# Patient Record
Sex: Male | Born: 1965 | Race: White | Hispanic: No | Smoking: Current every day smoker
Health system: Southern US, Community
[De-identification: ages and names within clinical notes are randomized; demographics above are authoritative.]

## PROBLEM LIST (undated history)

## (undated) DIAGNOSIS — F419 Anxiety disorder, unspecified: Secondary | ICD-10-CM

## (undated) DIAGNOSIS — I10 Essential (primary) hypertension: Secondary | ICD-10-CM

## (undated) DIAGNOSIS — I119 Hypertensive heart disease without heart failure: Secondary | ICD-10-CM

## (undated) DIAGNOSIS — R079 Chest pain, unspecified: Secondary | ICD-10-CM

## (undated) DIAGNOSIS — F329 Major depressive disorder, single episode, unspecified: Secondary | ICD-10-CM

## (undated) DIAGNOSIS — D519 Vitamin B12 deficiency anemia, unspecified: Secondary | ICD-10-CM

## (undated) DIAGNOSIS — A4902 Methicillin resistant Staphylococcus aureus infection, unspecified site: Secondary | ICD-10-CM

## (undated) DIAGNOSIS — N19 Unspecified kidney failure: Secondary | ICD-10-CM

## (undated) DIAGNOSIS — G4733 Obstructive sleep apnea (adult) (pediatric): Secondary | ICD-10-CM

## (undated) DIAGNOSIS — L4050 Arthropathic psoriasis, unspecified: Secondary | ICD-10-CM

## (undated) DIAGNOSIS — M549 Dorsalgia, unspecified: Secondary | ICD-10-CM

## (undated) DIAGNOSIS — F32A Depression, unspecified: Secondary | ICD-10-CM

## (undated) DIAGNOSIS — J449 Chronic obstructive pulmonary disease, unspecified: Secondary | ICD-10-CM

## (undated) DIAGNOSIS — R55 Syncope and collapse: Secondary | ICD-10-CM

## (undated) DIAGNOSIS — G459 Transient cerebral ischemic attack, unspecified: Secondary | ICD-10-CM

## (undated) DIAGNOSIS — I214 Non-ST elevation (NSTEMI) myocardial infarction: Secondary | ICD-10-CM

## (undated) DIAGNOSIS — I639 Cerebral infarction, unspecified: Secondary | ICD-10-CM

## (undated) DIAGNOSIS — I472 Ventricular tachycardia, unspecified: Secondary | ICD-10-CM

## (undated) DIAGNOSIS — E785 Hyperlipidemia, unspecified: Secondary | ICD-10-CM

## (undated) DIAGNOSIS — G8929 Other chronic pain: Secondary | ICD-10-CM

## (undated) DIAGNOSIS — Z720 Tobacco use: Secondary | ICD-10-CM

## (undated) DIAGNOSIS — I509 Heart failure, unspecified: Secondary | ICD-10-CM

## (undated) DIAGNOSIS — I219 Acute myocardial infarction, unspecified: Secondary | ICD-10-CM

## (undated) DIAGNOSIS — M1990 Unspecified osteoarthritis, unspecified site: Secondary | ICD-10-CM

## (undated) DIAGNOSIS — Z9989 Dependence on other enabling machines and devices: Secondary | ICD-10-CM

## (undated) DIAGNOSIS — I4891 Unspecified atrial fibrillation: Secondary | ICD-10-CM

## (undated) HISTORY — DX: Tobacco use: Z72.0

## (undated) HISTORY — DX: Transient cerebral ischemic attack, unspecified: G45.9

## (undated) HISTORY — DX: Depression, unspecified: F32.A

## (undated) HISTORY — DX: Methicillin resistant Staphylococcus aureus infection, unspecified site: A49.02

## (undated) HISTORY — DX: Syncope and collapse: R55

## (undated) HISTORY — DX: Hyperlipidemia, unspecified: E78.5

## (undated) HISTORY — DX: Unspecified atrial fibrillation: I48.91

## (undated) HISTORY — DX: Chest pain, unspecified: R07.9

## (undated) HISTORY — DX: Major depressive disorder, single episode, unspecified: F32.9

## (undated) HISTORY — DX: Ventricular tachycardia: I47.2

## (undated) HISTORY — DX: Ventricular tachycardia, unspecified: I47.20

## (undated) HISTORY — DX: Unspecified osteoarthritis, unspecified site: M19.90

---

## 2007-12-18 DIAGNOSIS — I219 Acute myocardial infarction, unspecified: Secondary | ICD-10-CM

## 2007-12-18 HISTORY — DX: Acute myocardial infarction, unspecified: I21.9

## 2008-12-17 HISTORY — PX: VENTRICULAR ABLATION SURGERY: SHX835

## 2010-12-21 HISTORY — PX: CARDIAC CATHETERIZATION: SHX172

## 2012-12-17 DIAGNOSIS — N19 Unspecified kidney failure: Secondary | ICD-10-CM

## 2012-12-17 HISTORY — DX: Unspecified kidney failure: N19

## 2013-01-22 ENCOUNTER — Encounter (HOSPITAL_COMMUNITY): Payer: Self-pay | Admitting: *Deleted

## 2013-01-22 ENCOUNTER — Emergency Department (HOSPITAL_COMMUNITY): Payer: Managed Care, Other (non HMO)

## 2013-01-22 ENCOUNTER — Inpatient Hospital Stay (HOSPITAL_COMMUNITY)
Admission: EM | Admit: 2013-01-22 | Discharge: 2013-01-24 | DRG: 683 | Disposition: A | Discharge status: Home / Self Care | Payer: Managed Care, Other (non HMO) | Attending: Internal Medicine | Admitting: Internal Medicine

## 2013-01-22 DIAGNOSIS — F17200 Nicotine dependence, unspecified, uncomplicated: Secondary | ICD-10-CM | POA: Diagnosis present

## 2013-01-22 DIAGNOSIS — I4729 Other ventricular tachycardia: Secondary | ICD-10-CM | POA: Diagnosis present

## 2013-01-22 DIAGNOSIS — Z8679 Personal history of other diseases of the circulatory system: Secondary | ICD-10-CM

## 2013-01-22 DIAGNOSIS — Z23 Encounter for immunization: Secondary | ICD-10-CM

## 2013-01-22 DIAGNOSIS — I252 Old myocardial infarction: Secondary | ICD-10-CM

## 2013-01-22 DIAGNOSIS — I2510 Atherosclerotic heart disease of native coronary artery without angina pectoris: Secondary | ICD-10-CM | POA: Diagnosis present

## 2013-01-22 DIAGNOSIS — N179 Acute kidney failure, unspecified: Principal | ICD-10-CM | POA: Diagnosis present

## 2013-01-22 DIAGNOSIS — I472 Ventricular tachycardia, unspecified: Secondary | ICD-10-CM | POA: Diagnosis present

## 2013-01-22 DIAGNOSIS — Z79899 Other long term (current) drug therapy: Secondary | ICD-10-CM

## 2013-01-22 DIAGNOSIS — E785 Hyperlipidemia, unspecified: Secondary | ICD-10-CM | POA: Diagnosis present

## 2013-01-22 DIAGNOSIS — R079 Chest pain, unspecified: Secondary | ICD-10-CM

## 2013-01-22 DIAGNOSIS — Z7982 Long term (current) use of aspirin: Secondary | ICD-10-CM

## 2013-01-22 DIAGNOSIS — Z8673 Personal history of transient ischemic attack (TIA), and cerebral infarction without residual deficits: Secondary | ICD-10-CM

## 2013-01-22 DIAGNOSIS — E860 Dehydration: Secondary | ICD-10-CM | POA: Diagnosis present

## 2013-01-22 HISTORY — DX: Essential (primary) hypertension: I10

## 2013-01-22 HISTORY — DX: Acute myocardial infarction, unspecified: I21.9

## 2013-01-22 HISTORY — DX: Cerebral infarction, unspecified: I63.9

## 2013-01-22 LAB — CBC WITH DIFFERENTIAL/PLATELET
Basophils Relative: 1 % (ref 0–1)
HCT: 42 % (ref 39.0–52.0)
Hemoglobin: 14.8 g/dL (ref 13.0–17.0)
Lymphocytes Relative: 26 % (ref 12–46)
Lymphs Abs: 2 10*3/uL (ref 0.7–4.0)
MCHC: 35.2 g/dL (ref 30.0–36.0)
Monocytes Absolute: 0.6 10*3/uL (ref 0.1–1.0)
Monocytes Relative: 7 % (ref 3–12)
Neutro Abs: 5 10*3/uL (ref 1.7–7.7)

## 2013-01-22 LAB — TROPONIN I: Troponin I: <0.3 ng/mL (ref <0.30)

## 2013-01-22 LAB — BASIC METABOLIC PANEL
BUN: 32 mg/dL — ABNORMAL HIGH (ref 6–23)
CO2: 24 mEq/L (ref 19–32)
Chloride: 101 mEq/L (ref 96–112)
Creatinine, Ser: 2.68 mg/dL — ABNORMAL HIGH (ref 0.50–1.35)
Glucose, Bld: 98 mg/dL (ref 70–99)

## 2013-01-22 LAB — URINALYSIS, ROUTINE W REFLEX MICROSCOPIC
Bilirubin Urine: NEGATIVE
Glucose, UA: NEGATIVE mg/dL
Hgb urine dipstick: NEGATIVE
Specific Gravity, Urine: >1.03 — ABNORMAL HIGH (ref 1.005–1.030)

## 2013-01-22 MED ORDER — ENOXAPARIN SODIUM 40 MG/0.4ML ~~LOC~~ SOLN
40.0000 mg | SUBCUTANEOUS | Status: DC
  Administered 2013-01-22 – 2013-01-23 (×2): 40 mg via SUBCUTANEOUS
  Filled 2013-01-22 (×2): qty 0.4

## 2013-01-22 MED ORDER — ONDANSETRON HCL 4 MG PO TABS: 4.0000 mg | ORAL_TABLET | Freq: Four times a day (QID) | ORAL | Status: DC | PRN

## 2013-01-22 MED ORDER — OXYCODONE-ACETAMINOPHEN 5-325 MG PO TABS
1.0000 | ORAL_TABLET | Freq: Once | ORAL | Status: AC
  Administered 2013-01-22: 1 via ORAL
  Filled 2013-01-22: qty 1

## 2013-01-22 MED ORDER — AMITRIPTYLINE HCL 25 MG PO TABS
25.0000 mg | ORAL_TABLET | Freq: Every day | ORAL | Status: DC
  Administered 2013-01-22 – 2013-01-23 (×2): 25 mg via ORAL
  Filled 2013-01-22 (×2): qty 1

## 2013-01-22 MED ORDER — INFLUENZA VIRUS VACC SPLIT PF IM SUSP
0.5000 mL | INTRAMUSCULAR | Status: AC
  Administered 2013-01-23: 0.5 mL via INTRAMUSCULAR
  Filled 2013-01-22: qty 0.5

## 2013-01-22 MED ORDER — ASPIRIN 325 MG PO TABS: 325.0000 mg | ORAL_TABLET | Freq: Once | ORAL | Status: DC

## 2013-01-22 MED ORDER — SODIUM CHLORIDE 0.9 % IV SOLN
INTRAVENOUS | Status: DC
  Administered 2013-01-22 – 2013-01-24 (×5): via INTRAVENOUS

## 2013-01-22 MED ORDER — NITROGLYCERIN 0.4 MG SL SUBL: 0.4000 mg | SUBLINGUAL_TABLET | SUBLINGUAL | Status: DC | PRN

## 2013-01-22 MED ORDER — ALBUTEROL SULFATE HFA 108 (90 BASE) MCG/ACT IN AERS: 2.0000 | INHALATION_SPRAY | Freq: Four times a day (QID) | RESPIRATORY_TRACT | Status: DC | PRN

## 2013-01-22 MED ORDER — OXYCODONE-ACETAMINOPHEN 5-325 MG PO TABS
2.0000 | ORAL_TABLET | Freq: Four times a day (QID) | ORAL | Status: DC | PRN
  Administered 2013-01-22 – 2013-01-24 (×6): 2 via ORAL
  Filled 2013-01-22 (×6): qty 2

## 2013-01-22 MED ORDER — SOTALOL HCL 80 MG PO TABS
ORAL_TABLET | ORAL | Status: AC
  Filled 2013-01-22: qty 1

## 2013-01-22 MED ORDER — ACETAMINOPHEN 650 MG RE SUPP: 650.0000 mg | Freq: Four times a day (QID) | RECTAL | Status: DC | PRN

## 2013-01-22 MED ORDER — SOTALOL HCL 80 MG PO TABS
80.0000 mg | ORAL_TABLET | Freq: Two times a day (BID) | ORAL | Status: DC
  Administered 2013-01-23 – 2013-01-24 (×3): 80 mg via ORAL
  Filled 2013-01-22 (×8): qty 1

## 2013-01-22 MED ORDER — ACETAMINOPHEN 325 MG PO TABS: 650.0000 mg | ORAL_TABLET | Freq: Four times a day (QID) | ORAL | Status: DC | PRN

## 2013-01-22 MED ORDER — ATORVASTATIN CALCIUM 40 MG PO TABS
40.0000 mg | ORAL_TABLET | Freq: Every day | ORAL | Status: DC
  Administered 2013-01-23: 40 mg via ORAL
  Filled 2013-01-22 (×2): qty 1

## 2013-01-22 MED ORDER — PANTOPRAZOLE SODIUM 40 MG PO TBEC
40.0000 mg | DELAYED_RELEASE_TABLET | Freq: Every day | ORAL | Status: DC
  Administered 2013-01-23 – 2013-01-24 (×2): 40 mg via ORAL
  Filled 2013-01-22 (×2): qty 1

## 2013-01-22 MED ORDER — ONDANSETRON HCL 4 MG/2ML IJ SOLN: 4.0000 mg | Freq: Four times a day (QID) | INTRAMUSCULAR | Status: DC | PRN

## 2013-01-22 MED ORDER — SODIUM CHLORIDE 0.9 % IV BOLUS (SEPSIS)
1000.0000 mL | Freq: Once | INTRAVENOUS | Status: AC
  Administered 2013-01-22: 1000 mL via INTRAVENOUS

## 2013-01-22 MED ORDER — LORAZEPAM 1 MG PO TABS
1.0000 mg | ORAL_TABLET | Freq: Three times a day (TID) | ORAL | Status: DC
  Administered 2013-01-22 – 2013-01-24 (×5): 1 mg via ORAL
  Filled 2013-01-22 (×5): qty 1

## 2013-01-22 MED ORDER — ASPIRIN 325 MG PO TABS
325.0000 mg | ORAL_TABLET | Freq: Every day | ORAL | Status: DC
  Administered 2013-01-23 – 2013-01-24 (×2): 325 mg via ORAL
  Filled 2013-01-22 (×2): qty 1

## 2013-01-22 MED ORDER — SODIUM CHLORIDE 0.9 % IJ SOLN: 3.0000 mL | Freq: Two times a day (BID) | INTRAMUSCULAR | Status: DC

## 2013-01-22 NOTE — ED Notes (Signed)
Pt with mid CP x 3 days, takes 325 mg ASA everyday and states has had today's dose, c/o dizziness x 1 week, SOB x 1 week, N/V/D x 1 week, states able to PO fluids, last vomited the other day

## 2013-01-22 NOTE — ED Notes (Signed)
Patient denies any dizziness upon sitting and standing.  

## 2013-01-22 NOTE — ED Notes (Signed)
Pt states he took one NTG yesterday without relief from CP, denies taking any today

## 2013-01-22 NOTE — ED Notes (Signed)
MD at bedside.-Memon at bedside

## 2013-01-22 NOTE — H&P (Signed)
[facility] History and Physical  Danny Shea WGN:[id number] DOB: October 28, 1966 DOA: 01/22/2013  Referring physician: Dr. Bebe Shaggy PCP: No primary provider on file.  Specialists: Cardiology in [place]  Chief Complaint: chest tightness  HPI: Danny Shea is a 47 y.o. male with multiple medical problems who recently moved back to [place] approximately 2 weeks ago. He has a history of coronary disease in the past as well as ventricular tachycardia. He presents to the emergency room today with complaints of chest pain. He describes this as a heaviness in his chest and "a ton of bricks" on his chest. These episodes have been intermittent lasting approximately 10 minutes. They occur on exertion and at rest. They're associated with shortness of breath and nausea. They're nonradiating. He reports having a stress test at [facility] approximately 2 years ago and was told that was unremarkable.  He also reports feeling increasingly dizzy and having episodes of presyncope. He's not had a frank syncopal episode. He feels that when he stands up he feels increasingly dizzy and feels as though he would pass out. He is also noticed that he is urinating less. He feels increasingly thirsty. Review of his medications show that he is on a ARB as well as hydrochlorothiazide. The patient also reports his by mouth intake over the past 2 weeks has not been good. He is felt increasingly nauseous for the past few weeks. He's also had intermittent episodes of vomiting, and for the last 3 days has reported some loose stools.  Workup in the emergency room revealed elevated creatinine. It is unclear whether this is acute. We'll be admitted for further evaluation.  Review of Systems: Pertinent positives as per history of present illness, otherwise  Past Medical History  Diagnosis Date  . Coronary artery disease   . Hypertension   . Hypercholesteremia   . Stroke   . Myocardial infarction     Past Surgical History  Procedure Date  . Cardiac electrophysiology study and ablation    Social History:  reports that he has been smoking.  He does not have any smokeless tobacco history on file. He reports that he does not drink alcohol or use illicit drugs.   Allergies  Allergen Reactions  . Darvocet (Propoxyphene-Acetaminophen)     Unknown reaction  . Feldene (Piroxicam)     Unknown reaction    History reviewed. No pertinent family history.   Prior to Admission medications   Medication Sig Start Date End Date Taking? Authorizing Provider  albuterol (PROVENTIL HFA;VENTOLIN HFA) 108 (90 BASE) MCG/ACT inhaler Inhale 2 puffs into the lungs every 6 (six) hours as needed. For shortness of breath/wheezing   Yes Historical Provider, MD  amitriptyline (ELAVIL) 25 MG tablet Take 25 mg by mouth at bedtime.   Yes Historical Provider, MD  aspirin 325 MG tablet Take 325 mg by mouth daily.    Yes Historical Provider, MD  atorvastatin (LIPITOR) 40 MG tablet Take 40 mg by mouth daily.   Yes Historical Provider, MD  LORazepam (ATIVAN) 1 MG tablet Take 1 mg by mouth every 8 (eight) hours.   Yes Historical Provider, MD  losartan-hydrochlorothiazide (HYZAAR) 100-25 MG per tablet Take 1 tablet by mouth daily.   Yes Historical Provider, MD  nitroGLYCERIN (NITROSTAT) 0.4 MG SL tablet Place 0.4 mg under the tongue every 5 (five) minutes as needed.   Yes Historical Provider, MD  omeprazole (PRILOSEC) 20 MG capsule Take 20 mg by mouth daily.   Yes Historical Provider, MD  sotalol (BETAPACE) 80 MG tablet Take 80 mg by mouth 2 (two) times daily.   Yes Historical Provider, MD   Physical Exam: Filed Vitals:   01/22/13 1626 01/22/13 1627 01/22/13 1628 01/22/13 1736  BP: 112/75 125/70 131/77 121/70  Pulse: 64 65 72 66  Temp:      TempSrc:      Resp:    18  Height:      Weight:      SpO2:    96%     General:  No acute distress  Eyes: Pupils are equal round reactive to light  ENT: Mucous  membranes are dry  Neck: Supple  Cardiovascular: S1, S2, regular rate and rhythm  Respiratory: Clear to auscultation bilaterally  Abdomen: Soft, nontender, nondistended, bowel sounds are active  Skin: Normal, various tattoos are present  Musculoskeletal: Deferred  Psychiatric: The patient is tearful, denies any history of worsening depression, reports that he is "emotional"  Neurologic: Grossly intact, nonfocal  Labs on Admission:  Basic Metabolic Panel:  Lab 01/22/13 4540  NA 137  K 3.9  CL 101  CO2 24  GLUCOSE 98  BUN 32*  CREATININE 2.68*  CALCIUM 9.7  MG --  PHOS --   Liver Function Tests: No results found for this basename: AST:5,ALT:5,ALKPHOS:5,BILITOT:5,PROT:5,ALBUMIN:5 in the last 168 hours No results found for this basename: LIPASE:5,AMYLASE:5 in the last 168 hours No results found for this basename: AMMONIA:5 in the last 168 hours CBC:  Lab 01/22/13 1534  WBC 7.8  NEUTROABS 5.0  HGB 14.8  HCT 42.0  MCV 89.0  PLT 230   Cardiac Enzymes:  Lab 01/22/13 1534  CKTOTAL --  CKMB --  CKMBINDEX --  TROPONINI <0.30    BNP (last 3 results) No results found for this basename: PROBNP:3 in the last 8760 hours CBG: No results found for this basename: GLUCAP:5 in the last 168 hours  Radiological Exams on Admission: Dg Chest Portable 1 View  01/22/2013  *RADIOLOGY REPORT*  Clinical Data: Chest tightness and dizziness.  Nausea.  Smoker. High blood pressure.  Prior heart attack.  PORTABLE CHEST - 1 VIEW  Comparison: None.  Findings: Mild central pulmonary vascular prominence.  No infiltrate, congestive heart failure or pneumothorax.  Heart size top normal.  IMPRESSION: Mild central pulmonary vascular prominence.  No infiltrate, congestive heart failure or pneumothorax.  Heart size top normal   Original Report Authenticated By: Lacy Duverney, M.D.     EKG: Independently reviewed. No acute ST-T changes  Assessment/Plan Active Problems:  Acute renal failure   Chest pain  Coronary artery disease  History of ventricular tachycardia  Dehydration  Tobacco abuse   1. Chest pain. Unclear etiology. Due to the patient's past history, he'll be admitted for observation. We will continue aspirin and recheck an EKG in the morning. Cardiology consultation will be obtained since he will likely need outpatient followup. We will continue the remainder of his outpatient medications. 2. Acute renal failure. Possibly related to dehydration in the setting of ARB and associated acute tubular necrosis. We will hold his ARB and hydrochlorothiazide for now. Provide IV hydration and check renal ultrasound. We'll also check urinalysis. If creatinine worsens, then he may need a nephrology evaluation. 3. History of ventricular tachycardia. Continue sotalol. 4. Hyperlipidemia. Continue Lipitor.    Code Status: Full code Family Communication: Discussed with patient at bedside Disposition Plan: Possible discharge tomorrow  Time spent: 60 minutes  [place] [facility] Pager [phone]  If 7PM-7AM, please contact night-coverage [email or web address] Password  [email or web address] 01/22/2013, 5:58 PM

## 2013-01-22 NOTE — ED Notes (Signed)
MD at bedside. 

## 2013-01-22 NOTE — ED Notes (Signed)
Chest pain, dizziness, nausea, for 3 days.  No injury

## 2013-01-22 NOTE — ED Provider Notes (Signed)
History     CSN: [id number]  Arrival date & time 01/22/13  1524   First MD Initiated Contact with Patient 01/22/13 1536      Chief Complaint  Patient presents with  . Chest Pain     Patient is a 47 y.o. male presenting with chest pain. The history is provided by the patient and the spouse.  Chest Pain Episode onset: a brief time ago. Chest pain occurs frequently. The chest pain is worsening. Associated with: nothing. The severity of the pain is moderate. The quality of the pain is described as pressure-like. The pain does not radiate. Primary symptoms include shortness of breath, vomiting and dizziness.  Dizziness also occurs with vomiting and diaphoresis.  Associated symptoms include diaphoresis. He tried aspirin (rest) for the symptoms.    Pt reports he has long h/o chest pain.  He reports he usually gets two episodes per week.  He now reports past several weeks he has had increased frequency of chest tightness    He also reports recent vomiting/diarrhea He also reports dizziness upon standing but no syncope reported Past Medical History  Diagnosis Date  . Coronary artery disease   . Hypertension   . Hypercholesteremia   . Stroke     Past Surgical History  Procedure Date  . Cardiac electrophysiology study and ablation     History reviewed. No pertinent family history.  History  Substance Use Topics  . Smoking status: Current Every Day Smoker  . Smokeless tobacco: Not on file  . Alcohol Use: No      Review of Systems  Constitutional: Positive for diaphoresis.  Respiratory: Positive for shortness of breath.   Cardiovascular: Positive for chest pain.  Gastrointestinal: Positive for vomiting and diarrhea.  Neurological: Positive for dizziness. Negative for syncope.  Psychiatric/Behavioral: Negative for agitation.  All other systems reviewed and are negative.    Allergies  Darvocet and Feldene  Home Medications  No current outpatient prescriptions on  file.  BP 138/65  Pulse 69  Temp 98.2 F (36.8 C) (Oral)  Resp 22  Ht 5\' 11"  (1.803 m)  Wt 210 lb (95.255 kg)  BMI 29.29 kg/m2  SpO2 99%  Physical Exam CONSTITUTIONAL: Well developed/well nourished HEAD AND FACE: Normocephalic/atraumatic EYES: EOMI/PERRL ENMT: Mucous membranes moist NECK: supple no meningeal signs SPINE:entire spine nontender CV: S1/S2 noted, no murmurs/rubs/gallops noted LUNGS: Lungs are clear to auscultation bilaterally, no apparent distress ABDOMEN: soft, nontender, no rebound or guarding GU:no cva tenderness NEURO: Pt is awake/alert, moves all extremitiesx4 EXTREMITIES: pulses normal, full ROM, no calf tenderness SKIN: warm, color normal PSYCH: no abnormalities of mood noted  ED Course  Procedures (including critical care time)   Labs Reviewed  CBC WITH DIFFERENTIAL  BASIC METABOLIC PANEL  TROPONIN I   Pt reports he has long h/o CP with increasing frequency.  Could be early unstable angina. He is feeling improved at this time.  He reports also h/o CAD and also v-tach previously.  This was all done at an outside hospital in [place].  Also, noted to renal failure that I suspect is new for him.  He does not recall history of renal failure.  He is on multiple meds (losartan/losartan) that could affect this as well as he could be dehydrated due to recent vomitng/diarrhea. D/w hospitalist to admit to tele  He has taken ASA Today  MDM  Nursing notes including past medical history and social history reviewed and considered in documentation Labs/vital reviewed and considered xrays reviewed and  considered       Date: 01/22/2013  Rate: 68  Rhythm: normal sinus rhythm  QRS Axis: normal  Intervals: normal  ST/T Wave abnormalities: nonspecific ST changes  Conduction Disutrbances:none  Narrative Interpretation:   Old EKG Reviewed: none available AT TIME OF INTERPRETATION    Joya Gaskins, MD 01/22/13 [phone]

## 2013-01-23 ENCOUNTER — Encounter (HOSPITAL_COMMUNITY): Payer: Self-pay | Admitting: Adult Health

## 2013-01-23 ENCOUNTER — Observation Stay (HOSPITAL_COMMUNITY): Payer: Managed Care, Other (non HMO)

## 2013-01-23 DIAGNOSIS — Z8679 Personal history of other diseases of the circulatory system: Secondary | ICD-10-CM

## 2013-01-23 DIAGNOSIS — R072 Precordial pain: Secondary | ICD-10-CM

## 2013-01-23 LAB — BASIC METABOLIC PANEL
BUN: 31 mg/dL — ABNORMAL HIGH (ref 6–23)
CO2: 22 mEq/L (ref 19–32)
Chloride: 102 mEq/L (ref 96–112)
GFR calc Af Amer: 38 mL/min — ABNORMAL LOW (ref >90)
Potassium: 3.5 mEq/L (ref 3.5–5.1)

## 2013-01-23 LAB — TROPONIN I: Troponin I: <0.3 ng/mL (ref <0.30)

## 2013-01-23 LAB — CBC
HCT: 39.3 % (ref 39.0–52.0)
Hemoglobin: 13.9 g/dL (ref 13.0–17.0)
MCV: 88.9 fL (ref 78.0–100.0)
WBC: 6.8 10*3/uL (ref 4.0–10.5)

## 2013-01-23 MED ORDER — ENSURE COMPLETE PO LIQD
237.0000 mL | Freq: Two times a day (BID) | ORAL | Status: DC
  Administered 2013-01-23: 237 mL via ORAL

## 2013-01-23 MED ORDER — OXYCODONE HCL 5 MG PO TABS
5.0000 mg | ORAL_TABLET | ORAL | Status: DC | PRN
  Administered 2013-01-23 – 2013-01-24 (×4): 5 mg via ORAL
  Filled 2013-01-23 (×4): qty 1

## 2013-01-23 MED ORDER — CHLORHEXIDINE GLUCONATE CLOTH 2 % EX PADS
6.0000 | MEDICATED_PAD | Freq: Every day | CUTANEOUS | Status: DC
  Administered 2013-01-23 – 2013-01-24 (×2): 6 via TOPICAL

## 2013-01-23 MED ORDER — MUPIROCIN 2 % EX OINT
1.0000 "application " | TOPICAL_OINTMENT | Freq: Two times a day (BID) | CUTANEOUS | Status: DC
  Administered 2013-01-23 – 2013-01-24 (×3): 1 via NASAL
  Filled 2013-01-23: qty 22

## 2013-01-23 NOTE — Progress Notes (Signed)
*  PRELIMINARY RESULTS* Echocardiogram 2D Echocardiogram has been performed.  Danny Shea 01/23/2013, 12:06 PM 

## 2013-01-23 NOTE — Consult Note (Signed)
CARDIOLOGY CONSULT NOTE  Patient ID: Danny Shea MRN: [id number] DOB/AGE: April 14, 1966 47 y.o.  Admit date: 01/22/2013 Referring Physician: PTH-Memon Primary PhysicianNo primary provider on file. Primary Cardiologist: (New) Danny Shea Reason for Consultation: Chest Pain with Hx of CAD, VT s/p ablation Active Problems:  Acute renal failure  Chest pain  Coronary artery disease  History of ventricular tachycardia  Dehydration  Tobacco abuse  HPI: Danny Shea is a 47 y/o patient who recently moved to [place] from [place]. He was admitted with recurrent pre-syncopal episodes and chest pain. He was found to be in renal failure with creatinine of 2.68. EKG showed sinus bradycardia without acute changes.   He had been followed by [facility], Danny Shea and by and EP cardiologist.Danny Shea, M.D. He has had a VT ablation in 2010, and has been treated with sotolol. He has been lost to cardiac follow up for > 2 years, as he states he did not get along with the primary cardiologist but continued to take his medications which were supplied by his PCP. He has a history of prior MI, but no interventions. He states that he had a stress test a couple of years or more ago that was negative. Records from [place] are being requested.   Review of systems complete and found to be negative unless listed above  Past Medical History  Diagnosis Date  . Coronary artery disease   . Hypertension   . Hypercholesteremia   . Stroke   . Myocardial infarction     Family History  Problem Relation Age of Onset  . Heart attack Mother   . Heart failure Mother   . Heart attack Brother   . Cancer Father     History   Social History  . Marital Status: Single    Spouse Name: N/A    Number of Children: N/A  . Years of Education: N/A   Occupational History  . Not on file.   Social History Main Topics  . Smoking status: Current Every Day Smoker -- 0.5 packs/day     Types: Cigarettes  . Smokeless tobacco: Not on file  . Alcohol Use: No  . Drug Use: No  . Sexually Active:      Comment: states has reduced to 1/2 pack ppd and is trying to quit "all of it"   Other Topics Concern  . Not on file   Social History Narrative  . No narrative on file    Past Surgical History  Procedure Date  . Cardiac electrophysiology study and ablation     Prescriptions prior to admission  Medication Sig Dispense Refill  . albuterol (PROVENTIL HFA;VENTOLIN HFA) 108 (90 BASE) MCG/ACT inhaler Inhale 2 puffs into the lungs every 6 (six) hours as needed. For shortness of breath/wheezing      . amitriptyline (ELAVIL) 25 MG tablet Take 25 mg by mouth at bedtime.      Marland Kitchen aspirin 325 MG tablet Take 325 mg by mouth daily.       Marland Kitchen atorvastatin (LIPITOR) 40 MG tablet Take 40 mg by mouth daily.      Marland Kitchen LORazepam (ATIVAN) 1 MG tablet Take 1 mg by mouth every 8 (eight) hours.      Marland Kitchen losartan-hydrochlorothiazide (HYZAAR) 100-25 MG per tablet Take 1 tablet by mouth daily.      . nitroGLYCERIN (NITROSTAT) 0.4 MG SL tablet Place 0.4 mg under the tongue every 5 (five) minutes as needed.      Marland Kitchen omeprazole (  PRILOSEC) 20 MG capsule Take 20 mg by mouth daily.      . sotalol (BETAPACE) 80 MG tablet Take 80 mg by mouth 2 (two) times daily.       Physical Exam: Blood pressure 95/60, pulse 60, temperature 97.5 F (36.4 C), temperature source Oral, resp. rate 18, height 5\' 11"  (1.803 m), weight 210 lb (95.255 kg), SpO2 99.00%.   Lab Results  Component Value Date   WBC 6.8 01/23/2013   HGB 13.9 01/23/2013   HCT 39.3 01/23/2013   MCV 88.9 01/23/2013   PLT 214 01/23/2013    Lab 01/23/13 0317  NA 136  K 3.5  CL 102  CO2 22  BUN 31*  CREATININE 2.23*  CALCIUM 9.0  PROT --  BILITOT --  ALKPHOS --  ALT --  AST --  GLUCOSE 115*   Lab Results  Component Value Date   TROPONINI <0.30 01/23/2013    Dg Chest Portable 1 View  01/22/2013  *RADIOLOGY REPORT*  Clinical Data: Chest tightness and  dizziness.  Nausea.  Smoker. High blood pressure.  Prior heart attack.  PORTABLE CHEST - 1 VIEW  Comparison: None.  Findings: Mild central pulmonary vascular prominence.  No infiltrate, congestive heart failure or pneumothorax.  Heart size top normal.  IMPRESSION: Mild central pulmonary vascular prominence.  No infiltrate, congestive heart failure or pneumothorax.  Heart size top normal   Original Report Authenticated By: Lacy Duverney, M.D.    NWG:NFAOZ bradycardia rate of 57 bpm.   Echocardiogram:  01/23/2013 mild to moderate biatrial enlargement; mild to moderate LVH; normal EF; no significant valvular abnormalities  ASSESSMENT AND PLAN:   1. Chest Pain: History per patient report of prior MI. He has not been followed by cardiology for greater than two years. Records from [facility] have been requested. Echocardiogram pending. EKG negative for ischemia and troponin is negative. Will be happy to follow him in our office once discharged.  2. History of VT s/p Ablation: Followed by EP in [place], placed on sotolol. He has been having frequent palpitations with near syncope for 2 months. 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 OP cardiac monitor is recommended on discharge for ongoing assessment associated with symptoms.  3.Acute Renal Failure: Creatinine has improved with IV hydration. Follow up labs will be completed next week. He is not on any nephrotoxic medications.   4. CAD: Records are requested. No evidence of prior MI on EKG  5. Hypertension: Currently controlled. No medication adjustments at this time.  Danny Mare. Lyman Bishop Danny Shea [name] 01/23/2013, 11:24 AM  Cardiology Attending Patient interviewed and examined. Discussed with Danny Reining,  Danny Shea.  Above note annotated and modified based upon my findings.  With normal left ventricular systolic function and no known RV dysplasia, WPW, HOCM or other structural heart disease, likelihood of a fatal arrhythmia is small. Recent episodes of syncope are likely do to prerenal azotemia and dehydration causing transient hypotension.  No records are currently available for review. Patient reports normal coronary angiography in 2011. Likelihood of interval development of significant coronary disease extremely low. Once acute renal failure has been adequately treated, patient can be discharged to followup in the office within one week.  Discharge medication should not include diuretic, ACE inhibitor or ARB.  [place] Bing, Danny Shea 01/23/2013, 7:26 PM

## 2013-01-23 NOTE — Progress Notes (Signed)
UR Chart Review Completed  

## 2013-01-23 NOTE — Progress Notes (Signed)
INITIAL NUTRITION ASSESSMENT  DOCUMENTATION CODES Per approved criteria  -Not Applicable   INTERVENTION: 1. Ensure Complete BID which provides 700 kcal and 26 grams of protein, per pt request: please provide with strawberry-flavored and place in freezer prior to him receiving it  2. RD will continue to monitor  NUTRITION DIAGNOSIS: Inadequate oral intake related to poor appetite as evidenced by patient report.   Goal: Meet >/=90% estimated nutrition needs  Monitor:  Tolerance of Ensure Complete, PO's, I/O's, weight trends  Reason for Assessment: Malnutrition Screening Tool  47 y.o. male  Admitting Dx: Chest pain, dizziness, nausea  ASSESSMENT: Pt admitted with c/o chest pain, dizziness, and nausea x3 days. Pt also experiencing vomiting associated with nausea.  Pt reports usual weight of 221 lbs although he is unsure of when he last weighed this. States that he has had a very poor appetite for ~1 week and that he feels like he is "starving to death" but once his meals come he feels full after a few small bites.  Pt at risk for malnutrition but unable to dx at this time based on inadequate information related to meal completion and time frame for his ~11 pound weight loss.  Will provide with Ensure Complete BID. Pt requests strawberry-flavored and would like it to be placed in the freezer prior to his receiving it.  Height: Ht Readings from Last 1 Encounters:  01/22/13 5\' 11"  (1.803 m)    Weight: Wt Readings from Last 1 Encounters:  01/22/13 210 lb (95.255 kg)    Ideal Body Weight: 172 lbs (78.2 kg)  % Ideal Body Weight: 122%  Wt Readings from Last 10 Encounters:  01/22/13 210 lb (95.255 kg)    Usual Body Weight: 221 lbs (100.5 kg)  % Usual Body Weight: 95%  BMI:  Body mass index is 29.29 kg/(m^2).--Overweight  Estimated Nutritional Needs: Kcal: 2000-2200 Protein: 100-110 grams Fluid: 2-2.2 L/day  Skin: intact, no wounds noted  Diet Order:  Cardiac  EDUCATION NEEDS: -No education needs identified at this time   Intake/Output Summary (Last 24 hours) at 01/23/13 0951 Last data filed at 01/23/13 0700  Gross per 24 hour  Intake 2129.17 ml  Output      1 ml  Net 2128.17 ml    Last BM: PTA  Labs:   Lab 01/23/13 0317 01/22/13 1534  NA 136 137  K 3.5 3.9  CL 102 101  CO2 22 24  BUN 31* 32*  CREATININE 2.23* 2.68*  CALCIUM 9.0 9.7  MG -- --  PHOS -- --  GLUCOSE 115* 98    CBG (last 3)  No results found for this basename: GLUCAP:3 in the last 72 hours  Scheduled Meds:   . amitriptyline  25 mg Oral QHS  . aspirin  325 mg Oral Once  . aspirin  325 mg Oral Daily  . atorvastatin  40 mg Oral q1800  . Chlorhexidine Gluconate Cloth  6 each Topical Q0600  . enoxaparin (LOVENOX) injection  40 mg Subcutaneous Q24H  . LORazepam  1 mg Oral Q8H  . mupirocin ointment  1 application Nasal BID  . pantoprazole  40 mg Oral Daily  . sodium chloride  3 mL Intravenous Q12H  . sotalol  80 mg Oral BID    Continuous Infusions:   . sodium chloride 125 mL/hr at 01/22/13 2053    Past Medical History  Diagnosis Date  . Coronary artery disease   . Hypertension   . Hypercholesteremia   . Stroke   .  Myocardial infarction     Past Surgical History  Procedure Date  . Cardiac electrophysiology study and ablation     [facility] Dietetic Intern # [phone]

## 2013-01-23 NOTE — Progress Notes (Signed)
Note reviewed. Agree with plan.  Lynn Fidencia Mccloud MS,RD,LDN Office: #[phone] Pager: #[phone] 

## 2013-01-23 NOTE — Care Management Note (Addendum)
    Page 1 of 1   01/23/2013     11:16:20 AM   CARE MANAGEMENT NOTE 01/23/2013  Patient:  EHSAN, CORVIN   Account Number:  [id number]  Date Initiated:  01/23/2013  Documentation initiated by:  Rosemary Holms  Subjective/Objective Assessment:   Pt admitted into Observation. Lives with his finance. Recently moved back to this area and needs a PCP. RCM called and Fanta's office able to see pt and ins.plan next week. Appt made.     Action/Plan:   Anticipated DC Date:  01/23/2013   Anticipated DC Plan:  HOME/SELF CARE      DC Planning Services  CM consult      Choice offered to / List presented to:             Status of service:  Completed, signed off Medicare Important Message given?   (If response is "NO", the following Medicare IM given date fields will be blank) Date Medicare IM given:   Date Additional Medicare IM given:    Discharge Disposition:  HOME/SELF CARE  Per UR Regulation:    If discussed at Long Length of Stay Meetings, dates discussed:    Comments:  01/23/13 Rosemary Holms RN BSN CM When [facility] gave info about appt to pt, he is unsure of his schedule next week. He is aware that if the date is not ok with his schedule, he needs to call Dr. Letitia Neri office to reschedule.  Dr. Letitia Neri office has agreed to follow Monday, 2/10 lab work. Dr. Felecia Shelling will call pt regarding results. Appt rescheduled for 2/17.

## 2013-01-23 NOTE — Progress Notes (Signed)
[facility] PROGRESS NOTE  Danny Shea GNF:[id number] DOB: Sep 21, 1966 DOA: 01/22/2013 PCP: No primary provider on file.  Assessment/Plan: 1. Chest pain. The patient has ruled out for acute coronary syndrome with negative cardiac enzymes. EKG does not show acute changes. Cardiology has been consulted. 2-D echo has been completed, report pending. 2. Acute renal failure. Likely prerenal secondary to dehydration as well as ACE inhibitor. Renal function has improved with IV fluids. We'll continue to give IV fluids for now. Followup renal ultrasound. Urinalysis does not show any signs of infections and no proteinuria. Patient will need repeat creatinine next week to ensure that is continuing to decrease. 3. Presyncope. Likely related to dehydration. Improved 4. History of VT status post ablation. Continue on sotalol  Code Status: full Family Communication: discussed with patient and wife at the bedside Disposition Plan: discharge later today or tomorrow, once cleared by cardiology   Consultants:  [facility]  Procedures:  none  Antibiotics:  none  HPI/Subjective: Patient is feeling better today.  Denies any chest pain, no dizziness on standing.  No vomiting or diarrhea.  Objective: Filed Vitals:   01/22/13 1736 01/22/13 1811 01/22/13 2214 01/23/13 0456  BP: 121/70 100/62 115/62 95/60  Pulse: 66 59 55 60  Temp:  97.7 F (36.5 C) 97.4 F (36.3 C) 97.5 F (36.4 C)  TempSrc:  Oral  Oral  Resp: 18 18 18    Height:  5\' 11"  (1.803 m)    Weight:  95.255 kg (210 lb)    SpO2: 96% 97% 99%     Intake/Output Summary (Last 24 hours) at 01/23/13 1142 Last data filed at 01/23/13 0800  Gross per 24 hour  Intake 2489.17 ml  Output      1 ml  Net 2488.17 ml   Filed Weights   01/22/13 1533 01/22/13 1811  Weight: 95.255 kg (210 lb) 95.255 kg (210 lb)    Exam:   General:  NAD  Cardiovascular: S1, S2, RRR  Respiratory: CTA B  Abdomen: soft, nt, nd, bs+  Data  Reviewed: Basic Metabolic Panel:  Lab 01/23/13 8469 01/22/13 1534  NA 136 137  K 3.5 3.9  CL 102 101  CO2 22 24  GLUCOSE 115* 98  BUN 31* 32*  CREATININE 2.23* 2.68*  CALCIUM 9.0 9.7  MG -- --  PHOS -- --   Liver Function Tests: No results found for this basename: AST:5,ALT:5,ALKPHOS:5,BILITOT:5,PROT:5,ALBUMIN:5 in the last 168 hours No results found for this basename: LIPASE:5,AMYLASE:5 in the last 168 hours No results found for this basename: AMMONIA:5 in the last 168 hours CBC:  Lab 01/23/13 0317 01/22/13 1534  WBC 6.8 7.8  NEUTROABS -- 5.0  HGB 13.9 14.8  HCT 39.3 42.0  MCV 88.9 89.0  PLT 214 230   Cardiac Enzymes:  Lab 01/23/13 0316 01/22/13 2112 01/22/13 1534  CKTOTAL -- -- --  CKMB -- -- --  CKMBINDEX -- -- --  TROPONINI <0.30 <0.30 <0.30   BNP (last 3 results) No results found for this basename: PROBNP:3 in the last 8760 hours CBG: No results found for this basename: GLUCAP:5 in the last 168 hours  Recent Results (from the past 240 hour(s))  MRSA PCR SCREENING     Status: Abnormal   Collection Time   01/22/13  5:40 PM      Component Value Range Status Comment   MRSA by PCR POSITIVE (*) NEGATIVE Final      Studies: Dg Chest Portable 1 View  01/22/2013  *RADIOLOGY REPORT*  Clinical Data: Chest tightness and dizziness.  Nausea.  Smoker. High blood pressure.  Prior heart attack.  PORTABLE CHEST - 1 VIEW  Comparison: None.  Findings: Mild central pulmonary vascular prominence.  No infiltrate, congestive heart failure or pneumothorax.  Heart size top normal.  IMPRESSION: Mild central pulmonary vascular prominence.  No infiltrate, congestive heart failure or pneumothorax.  Heart size top normal   Original Report Authenticated By: Lacy Duverney, M.D.     Scheduled Meds:   . amitriptyline  25 mg Oral QHS  . aspirin  325 mg Oral Once  . aspirin  325 mg Oral Daily  . atorvastatin  40 mg Oral q1800  . Chlorhexidine Gluconate Cloth  6 each Topical Q0600  .  enoxaparin (LOVENOX) injection  40 mg Subcutaneous Q24H  . LORazepam  1 mg Oral Q8H  . mupirocin ointment  1 application Nasal BID  . pantoprazole  40 mg Oral Daily  . sodium chloride  3 mL Intravenous Q12H  . sotalol  80 mg Oral BID   Continuous Infusions:   . sodium chloride 125 mL/hr at 01/22/13 2053    Active Problems:  Acute renal failure  Chest pain  Coronary artery disease  History of ventricular tachycardia  Dehydration  Tobacco abuse    Time spent:    Danny Shea  [facility] Pager [phone]. If 8PM-8AM, please contact night-coverage at [email or web address], password [facility] 01/23/2013, 11:42 AM  LOS: 1 day

## 2013-01-24 LAB — BASIC METABOLIC PANEL
BUN: 22 mg/dL (ref 6–23)
Creatinine, Ser: 1.66 mg/dL — ABNORMAL HIGH (ref 0.50–1.35)
GFR calc Af Amer: 55 mL/min — ABNORMAL LOW (ref >90)
GFR calc non Af Amer: 47 mL/min — ABNORMAL LOW (ref >90)
Glucose, Bld: 93 mg/dL (ref 70–99)

## 2013-01-24 NOTE — Discharge Summary (Signed)
Physician Discharge Summary  Danny Shea EAV:[id number] DOB: 07-20-1966 DOA: 01/22/2013  PCP: No primary provider on file.  Admit date: 01/22/2013 Discharge date: 01/24/2013  Time spent: 40 minutes  Recommendations for Outpatient Follow-up:  1. Follow up with [facility] next week 2. Follow up with Dr. Felecia Shelling on 2/17 3. Have repeat creatinine check on 2/10, with results sent to Dr. Felecia Shelling  Discharge Diagnoses:  Active Problems:   Acute renal failure   Chest pain   Coronary artery disease   History of ventricular tachycardia   Dehydration   Tobacco abuse   Discharge Condition: improved  Diet recommendation: low salt  Filed Weights   01/22/13 1533 01/22/13 1811  Weight: 95.255 kg (210 lb) 95.255 kg (210 lb)    History of present illness:  Danny Shea is a 47 y.o. male with multiple medical problems who recently moved back to [place] approximately 2 weeks ago. He has a history of coronary disease in the past as well as ventricular tachycardia. He presents to the emergency room today with complaints of chest pain. He describes this as a heaviness in his chest and "a ton of bricks" on his chest. These episodes have been intermittent lasting approximately 10 minutes. They occur on exertion and at rest. They're associated with shortness of breath and nausea. They're nonradiating. He reports having a stress test at [facility] approximately 2 years ago and was told that was unremarkable.  He also reports feeling increasingly dizzy and having episodes of presyncope. He's not had a frank syncopal episode. He feels that when he stands up he feels increasingly dizzy and feels as though he would pass out. He is also noticed that he is urinating less. He feels increasingly thirsty. Review of his medications show that he is on a ARB as well as hydrochlorothiazide. The patient also reports his by mouth intake over the past 2 weeks has not been good. He is felt  increasingly nauseous for the past few weeks. He's also had intermittent episodes of vomiting, and for the last 3 days has reported some loose stools.  Workup in the emergency room revealed elevated creatinine. It is unclear whether this is acute. We'll be admitted for further evaluation.   Hospital Course:  Chest pain. Patient ruled out for acute coronary syndrome with negative cardiac enzymes. He did not have any EKG changes. 2-D echocardiogram was unremarkable. He was seen by cardiology and will plan for outpatient cardiology followup.  Acute renal failure, secondary to dehydration the setting of ARB treatment. Patient's ARB was held. He was given IV fluids and his creatinine has since improved. Current creatinine is 1.6. He's been advised to continue with hydration. He feels significantly better. Renal ultrasound was unremarkable. Urinalysis did not show any proteinuria. He was advised not to restart the ARB and continue with hydration. We will plan on a repeat basic metabolic panel early next week with results to primary care doctor.  History of ventricular tachycardia. Patient could not swallow. He did not have any significant findings on telemetry.  Presyncope. Likely related to bony palpation and dehydration. This has since improved since admission as patient ambulated without any problems.  Patient is significantly improved. He has appropriate followup scheduled. We'll plan on discharging home today.  Procedures: Echo: - Left ventricle: The cavity size was normal. There was mild tomoderate concentric hypertrophy. Systolic function was normal. The estimated ejection fraction was in the range of 55% to 60%. Wall motion was normal; there were no  regional wall motion abnormalities. - Left atrium: The atrium was mildly to moderately dilated. - Right ventricle: The cavity size was mildly dilated. Wall thickness was normal. - Right atrium: The atrium was mildly to moderately  dilated.     Consultations: [facility]  Discharge Exam: Filed Vitals:   01/23/13 0456 01/23/13 1502 01/23/13 2100 01/24/13 0701  BP: 95/60 98/54 143/87 105/57  Pulse: 60 69 66 72  Temp: 97.5 F (36.4 C) 97.9 F (36.6 C) 97.3 F (36.3 C) 97.6 F (36.4 C)  TempSrc: Oral Oral Oral Oral  Resp:  18 18 20   Height:      Weight:      SpO2:  98% 98% 99%    General: NAD Cardiovascular: S1, S2, RRR Respiratory: CTA B  Discharge Instructions  Discharge Orders   Future Orders Complete By Expires     Call MD for:  difficulty breathing, headache or visual disturbances  As directed     Call MD for:  persistant dizziness or light-headedness  As directed     Call MD for:  temperature >100.4  As directed     Diet - low sodium heart healthy  As directed     Increase activity slowly  As directed         Medication List    STOP taking these medications       losartan-hydrochlorothiazide 100-25 MG per tablet  Commonly known as:  HYZAAR      TAKE these medications       albuterol 108 (90 BASE) MCG/ACT inhaler  Commonly known as:  PROVENTIL HFA;VENTOLIN HFA  Inhale 2 puffs into the lungs every 6 (six) hours as needed. For shortness of breath/wheezing     amitriptyline 25 MG tablet  Commonly known as:  ELAVIL  Take 25 mg by mouth at bedtime.     aspirin 325 MG tablet  Take 325 mg by mouth daily.     atorvastatin 40 MG tablet  Commonly known as:  LIPITOR  Take 40 mg by mouth daily.     LORazepam 1 MG tablet  Commonly known as:  ATIVAN  Take 1 mg by mouth every 8 (eight) hours.     nitroGLYCERIN 0.4 MG SL tablet  Commonly known as:  NITROSTAT  Place 0.4 mg under the tongue every 5 (five) minutes as needed.     omeprazole 20 MG capsule  Commonly known as:  PRILOSEC  Take 20 mg by mouth daily.     sotalol 80 MG tablet  Commonly known as:  BETAPACE  Take 80 mg by mouth 2 (two) times daily.           Follow-up Information   Follow up with [facility],  MD On 02/02/2013. (@ 10:10, If unable to make this appt, please call to reschedule)    Contact information:   [address] [place] [address] [phone]       Follow up with Lab work On 01/26/2013. (blood work can be drawn at [facility] [address] [place] [address], [phone]   )    Contact information:   Results to be called to Dr. Felecia Shelling and pt will be notifed by Dr. Felecia Shelling regarding the results.      Follow up with [facility]ology next week..       The results of significant diagnostics from this hospitalization (including imaging, microbiology, ancillary and laboratory) are listed below for reference.    Significant Diagnostic Studies: US Renal  01/23/2013  *  RADIOLOGY REPORT*  Clinical Data: 47 year old male with acute renal failure. Hypertension.  RENAL/URINARY TRACT ULTRASOUND COMPLETE  Comparison:  None.  Findings:  Right Kidney:  No hydronephrosis.  Cortical echotexture within normal limits.  Renal length 12.0 cm.  Left Kidney:  No hydronephrosis.  Echotexture within normal limits. Renal length 12.1 cm.  Bladder:  Unremarkable.  Other findings:  hepatic steatosis.  IMPRESSION:  1.  No obstructive uropathy. 2.  Hepatic steatosis.   Original Report Authenticated By: Erskine Speed, M.D.    Dg Chest Portable 1 View  01/22/2013  *RADIOLOGY REPORT*  Clinical Data: Chest tightness and dizziness.  Nausea.  Smoker. High blood pressure.  Prior heart attack.  PORTABLE CHEST - 1 VIEW  Comparison: None.  Findings: Mild central pulmonary vascular prominence.  No infiltrate, congestive heart failure or pneumothorax.  Heart size top normal.  IMPRESSION: Mild central pulmonary vascular prominence.  No infiltrate, congestive heart failure or pneumothorax.  Heart size top normal   Original Report Authenticated By: Lacy Duverney, M.D.     Microbiology: Recent Results (from the past 240 hour(s))  MRSA PCR SCREENING     Status: Abnormal   Collection Time    01/22/13  5:40 PM       Result Value Range Status   MRSA by PCR POSITIVE (*) NEGATIVE Final   Comment:            The GeneXpert MRSA Assay (FDA     approved for NASAL specimens     only), is one component of a     comprehensive MRSA colonization     surveillance program. It is not     intended to diagnose MRSA     infection nor to guide or     monitor treatment for     MRSA infections.     RESULT CALLED TO, READ BACK BY AND VERIFIED WITH:     Kym Groom AT 2016 ON [id number] BY FORSYTH K     Labs: Basic Metabolic Panel:  Recent Labs Lab 01/22/13 1534 01/23/13 0317 01/24/13 0551  NA 137 136 140  K 3.9 3.5 4.3  CL 101 102 106  CO2 24 22 27   GLUCOSE 98 115* 93  BUN 32* 31* 22  CREATININE 2.68* 2.23* 1.66*  CALCIUM 9.7 9.0 8.9   Liver Function Tests: No results found for this basename: AST, ALT, ALKPHOS, BILITOT, PROT, ALBUMIN,  in the last 168 hours No results found for this basename: LIPASE, AMYLASE,  in the last 168 hours No results found for this basename: AMMONIA,  in the last 168 hours CBC:  Recent Labs Lab 01/22/13 1534 01/23/13 0317  WBC 7.8 6.8  NEUTROABS 5.0  --   HGB 14.8 13.9  HCT 42.0 39.3  MCV 89.0 88.9  PLT 230 214   Cardiac Enzymes:  Recent Labs Lab 01/22/13 1534 01/22/13 2112 01/23/13 0316  TROPONINI <0.30 <0.30 <0.30   BNP: BNP (last 3 results) No results found for this basename: PROBNP,  in the last 8760 hours CBG: No results found for this basename: GLUCAP,  in the last 168 hours     Signed:  MEMON,JEHANZEB  [facility] 01/24/2013, 10:49 AM

## 2013-01-26 ENCOUNTER — Ambulatory Visit (INDEPENDENT_AMBULATORY_CARE_PROVIDER_SITE_OTHER): Payer: Managed Care, Other (non HMO) | Admitting: Cardiology

## 2013-01-26 ENCOUNTER — Encounter: Payer: Self-pay | Admitting: Cardiology

## 2013-01-26 VITALS — BP 142/85 | HR 68 | Ht 71.0 in | Wt 211.0 lb

## 2013-01-26 DIAGNOSIS — I10 Essential (primary) hypertension: Secondary | ICD-10-CM

## 2013-01-26 DIAGNOSIS — E785 Hyperlipidemia, unspecified: Secondary | ICD-10-CM | POA: Insufficient documentation

## 2013-01-26 DIAGNOSIS — Z720 Tobacco use: Secondary | ICD-10-CM | POA: Insufficient documentation

## 2013-01-26 DIAGNOSIS — I472 Ventricular tachycardia: Secondary | ICD-10-CM | POA: Insufficient documentation

## 2013-01-26 DIAGNOSIS — R55 Syncope and collapse: Secondary | ICD-10-CM | POA: Insufficient documentation

## 2013-01-26 DIAGNOSIS — F17200 Nicotine dependence, unspecified, uncomplicated: Secondary | ICD-10-CM

## 2013-01-26 DIAGNOSIS — I639 Cerebral infarction, unspecified: Secondary | ICD-10-CM | POA: Insufficient documentation

## 2013-01-26 DIAGNOSIS — N179 Acute kidney failure, unspecified: Secondary | ICD-10-CM

## 2013-01-26 NOTE — Patient Instructions (Addendum)
Your physician recommends that you schedule a follow-up appointment in: 3 WEEKS WITH RR  YOUR PHYSICIAN RECOMMENDS THAT YOU CEASE SMOKING FOR YOUR HEALTH BENEFITS  YOUR PHYSICIAN RECOMMENDS THAT YOU RECORD YOUR BLOOD PRESSURE READINGS DAILY AND BRING THEM WITH YOU AT YOUR NEXT OFFICE VISIT. PLEASE CONTACT OFFICE IF YOUR SYSTOLIC IS ABOVE 160 AND YOUR DIASTOLIC IS ABOVE 100 AT [phone]

## 2013-01-26 NOTE — Progress Notes (Signed)
Patient ID: Danny Shea, male   DOB: [birth year], 47 y.o.   MRN: [id number]  HPI: Initial office visit for this nice gentleman recently evaluated in hospital for syncope. Prerenal azotemia was identified, which resolved with discontinuation of his antihypertensive medication and intravenous hydration. Blood pressure was low in hospital and has been higher, but still normal since discharge. He is seen at short notice and do to his plans to travel out of town for the next 2 weeks. He denies further lightheadedness or syncope.  He reports smoking only a few cigarettes per day  Prior to Admission medications   Medication Sig Start Date End Date Taking? Authorizing Provider  albuterol (PROVENTIL HFA;VENTOLIN HFA) 108 (90 BASE) MCG/ACT inhaler Inhale 2 puffs into the lungs every 6 (six) hours as needed. For shortness of breath/wheezing   Yes Historical Provider, MD  amitriptyline (ELAVIL) 25 MG tablet Take 25 mg by mouth at bedtime.   Yes Historical Provider, MD  aspirin 325 MG tablet Take 325 mg by mouth daily.    Yes Historical Provider, MD  atorvastatin (LIPITOR) 40 MG tablet Take 40 mg by mouth daily.   Yes Historical Provider, MD  imipramine (TOFRANIL) 25 MG tablet Take 25 mg by mouth 3 (three) times daily.   Yes Historical Provider, MD  LORazepam (ATIVAN) 1 MG tablet Take 1 mg by mouth every 8 (eight) hours.   Yes Historical Provider, MD  nitroGLYCERIN (NITROSTAT) 0.4 MG SL tablet Place 0.4 mg under the tongue every 5 (five) minutes as needed.   Yes Historical Provider, MD  omeprazole (PRILOSEC) 20 MG capsule Take 20 mg by mouth daily.   Yes Historical Provider, MD  oxycodone (OXY-IR) 5 MG capsule Take 5 mg by mouth every 4 (four) hours as needed for pain.   Yes Historical Provider, MD  oxyCODONE-acetaminophen (PERCOCET) 10-325 MG per tablet Take 1 tablet by mouth every 4 (four) hours as needed for pain.   Yes Historical Provider, MD  promethazine (PHENERGAN) 25 MG tablet Take 25 mg by mouth 3  (three) times daily.   Yes Historical Provider, MD  sotalol (BETAPACE) 80 MG tablet Take 80 mg by mouth 2 (two) times daily. As needed   Yes Historical Provider, MD   Allergies  Allergen Reactions  . Darvocet (Propoxyphene-Acetaminophen)     Unknown reaction  . Feldene (Piroxicam)     Unknown reaction  Past medical history, social history, and family history reviewed and updated.  ROS: Denies chest discomfort, dyspnea, orthopnea or PND. He has had minimal edema. He wonders whether he should redo his very old prescription for nitroglycerin. All other systems reviewed and are negative.  PHYSICAL EXAM: BP 142/85  Pulse 68  Ht 5\' 11"  (1.803 m)  Wt 95.709 kg (211 lb)  BMI 29.44 kg/m2  General-Well developed; no acute distress Body habitus-proportionate weight and height Neck-No JVD; no carotid bruits Lungs-few expiratory rhonchi; resonant to percussion Cardiovascular-normal PMI; normal S1 and S2 Abdomen-normal bowel sounds; soft and non-tender without masses or organomegaly Musculoskeletal-No deformities, no cyanosis or clubbing Neurologic-Normal cranial nerves; symmetric strength and tone Skin-Warm, no significant lesions Extremities-distal pulses intact; no edema  ASSESSMENT AND PLAN:  [place] Bing, MD 01/26/2013 4:57 PM

## 2013-01-26 NOTE — Assessment & Plan Note (Signed)
Patient encouraged to discontinue tobacco use entirely. He indicates that this is his plan.

## 2013-01-26 NOTE — Assessment & Plan Note (Signed)
Creatinine had decreased to 1.66 at the time of hospital discharge 2 days ago. Patient's PCP is following and treating this problem, but I suspect BUN and creatinine will return to entirely normal levels.

## 2013-01-26 NOTE — Progress Notes (Deleted)
Name: Danny Shea    DOB: 12-15-1966  Age: 47 y.o.  MR#: [id number]       PCP:  Kendell Bane, MD      Insurance: @PAYORNAME @   CC:   RENAL US 01-22-13 ECHO 01-23-13  VS BP 142/85  Pulse 68  Ht 5\' 11"  (1.803 m)  Wt 211 lb (95.709 kg)  BMI 29.44 kg/m2  Weights Current Weight  01/26/13 211 lb (95.709 kg)  01/22/13 210 lb (95.255 kg)    Blood Pressure  BP Readings from Last 3 Encounters:  01/26/13 142/85  01/24/13 105/57     Admit date:  (Not on file) Last encounter with RMR:  Visit date not found   Allergy Allergies  Allergen Reactions  . Darvocet (Propoxyphene-Acetaminophen)     Unknown reaction  . Feldene (Piroxicam)     Unknown reaction    Current Outpatient Prescriptions  Medication Sig Dispense Refill  . albuterol (PROVENTIL HFA;VENTOLIN HFA) 108 (90 BASE) MCG/ACT inhaler Inhale 2 puffs into the lungs every 6 (six) hours as needed. For shortness of breath/wheezing      . amitriptyline (ELAVIL) 25 MG tablet Take 25 mg by mouth at bedtime.      Marland Kitchen aspirin 325 MG tablet Take 325 mg by mouth daily.       Marland Kitchen atorvastatin (LIPITOR) 40 MG tablet Take 40 mg by mouth daily.      Marland Kitchen imipramine (TOFRANIL) 25 MG tablet Take 25 mg by mouth 3 (three) times daily.      Marland Kitchen LORazepam (ATIVAN) 1 MG tablet Take 1 mg by mouth every 8 (eight) hours.      . nitroGLYCERIN (NITROSTAT) 0.4 MG SL tablet Place 0.4 mg under the tongue every 5 (five) minutes as needed.      Marland Kitchen omeprazole (PRILOSEC) 20 MG capsule Take 20 mg by mouth daily.      Marland Kitchen oxycodone (OXY-IR) 5 MG capsule Take 5 mg by mouth every 4 (four) hours as needed for pain.      Marland Kitchen oxyCODONE-acetaminophen (PERCOCET) 10-325 MG per tablet Take 1 tablet by mouth every 4 (four) hours as needed for pain.      . promethazine (PHENERGAN) 25 MG tablet Take 25 mg by mouth 3 (three) times daily.      . sotalol (BETAPACE) 80 MG tablet Take 80 mg by mouth 2 (two) times daily. As needed       No current facility-administered medications for this  visit.    Discontinued Meds:   There are no discontinued medications.  Patient Active Problem List  Diagnosis  . Acute renal failure  . Hypertension  . Hyperlipidemia  . Stroke  . Ventricular tachycardia  . Syncope  . Tobacco abuse    LABS Admission on 01/22/2013, Discharged on 01/24/2013  Component Date Value  . WBC 01/22/2013 7.8   . RBC 01/22/2013 4.72   . Hemoglobin 01/22/2013 14.8   . HCT 01/22/2013 42.0   . MCV 01/22/2013 89.0   . [facility] 01/22/2013 31.4   . MCHC 01/22/2013 35.2   . RDW 01/22/2013 13.2   . Platelets 01/22/2013 230   . Neutrophils Relative 01/22/2013 63   . Neutro Abs 01/22/2013 5.0   . Lymphocytes Relative 01/22/2013 26   . Lymphs Abs 01/22/2013 2.0   . Monocytes Relative 01/22/2013 7   . Monocytes Absolute 01/22/2013 0.6   . Eosinophils Relative 01/22/2013 3   . Eosinophils Absolute 01/22/2013 0.3   . Basophils Relative 01/22/2013 1   .  Basophils Absolute 01/22/2013 0.0   . Sodium 01/22/2013 137   . Potassium 01/22/2013 3.9   . Chloride 01/22/2013 101   . CO2 01/22/2013 24   . Glucose, Bld 01/22/2013 98   . BUN 01/22/2013 32*  . Creatinine, Ser 01/22/2013 2.68*  . Calcium 01/22/2013 9.7   . GFR calc non Af Amer 01/22/2013 26*  . GFR calc Af Amer 01/22/2013 31*  . Troponin I 01/22/2013 <0.30   . TSH 01/22/2013 0.894   . Troponin I 01/22/2013 <0.30   . Troponin I 01/23/2013 <0.30   . Sodium 01/23/2013 136   . Potassium 01/23/2013 3.5   . Chloride 01/23/2013 102   . CO2 01/23/2013 22   . Glucose, Bld 01/23/2013 115*  . BUN 01/23/2013 31*  . Creatinine, Ser 01/23/2013 2.23*  . Calcium 01/23/2013 9.0   . GFR calc non Af Amer 01/23/2013 33*  . GFR calc Af Amer 01/23/2013 38*  . WBC 01/23/2013 6.8   . RBC 01/23/2013 4.42   . Hemoglobin 01/23/2013 13.9   . HCT 01/23/2013 39.3   . MCV 01/23/2013 88.9   . [facility] 01/23/2013 31.4   . MCHC 01/23/2013 35.4   . RDW 01/23/2013 13.2   . Platelets 01/23/2013 214   . Color, Urine 01/22/2013 YELLOW    . APPearance 01/22/2013 CLEAR   . Specific Gravity, Urine 01/22/2013 >1.030*  . pH 01/22/2013 5.5   . Glucose, UA 01/22/2013 NEGATIVE   . Hgb urine dipstick 01/22/2013 NEGATIVE   . Bilirubin Urine 01/22/2013 NEGATIVE   . Ketones, ur 01/22/2013 NEGATIVE   . Protein, ur 01/22/2013 NEGATIVE   . Urobilinogen, UA 01/22/2013 0.2   . Nitrite 01/22/2013 NEGATIVE   . Leukocytes, UA 01/22/2013 NEGATIVE   . MRSA by PCR 01/22/2013 POSITIVE*  . Sodium 01/24/2013 140   . Potassium 01/24/2013 4.3   . Chloride 01/24/2013 106   . CO2 01/24/2013 27   . Glucose, Bld 01/24/2013 93   . BUN 01/24/2013 22   . Creatinine, Ser 01/24/2013 1.66*  . Calcium 01/24/2013 8.9   . GFR calc non Af Amer 01/24/2013 47*  . GFR calc Af Amer 01/24/2013 55*     Results for this Opt Visit:     Results for orders placed during the hospital encounter of 01/22/13  MRSA PCR SCREENING      Result Value Range   MRSA by PCR POSITIVE (*) NEGATIVE  CBC WITH DIFFERENTIAL      Result Value Range   WBC 7.8  4.0 - 10.5 K/uL   RBC 4.72  4.22 - 5.81 MIL/uL   Hemoglobin 14.8  13.0 - 17.0 g/dL   HCT 60.4  54.0 - 98.1 %   MCV 89.0  78.0 - 100.0 fL   MCH 31.4  26.0 - 34.0 pg   MCHC 35.2  30.0 - 36.0 g/dL   RDW 19.1  47.8 - 29.5 %   Platelets 230  150 - 400 K/uL   Neutrophils Relative 63  43 - 77 %   Neutro Abs 5.0  1.7 - 7.7 K/uL   Lymphocytes Relative 26  12 - 46 %   Lymphs Abs 2.0  0.7 - 4.0 K/uL   Monocytes Relative 7  3 - 12 %   Monocytes Absolute 0.6  0.1 - 1.0 K/uL   Eosinophils Relative 3  0 - 5 %   Eosinophils Absolute 0.3  0.0 - 0.7 K/uL   Basophils Relative 1  0 - 1 %   Basophils  Absolute 0.0  0.0 - 0.1 K/uL  BASIC METABOLIC PANEL      Result Value Range   Sodium 137  135 - 145 mEq/L   Potassium 3.9  3.5 - 5.1 mEq/L   Chloride 101  96 - 112 mEq/L   CO2 24  19 - 32 mEq/L   Glucose, Bld 98  70 - 99 mg/dL   BUN 32 (*) 6 - 23 mg/dL   Creatinine, Ser 1.61 (*) 0.50 - 1.35 mg/dL   Calcium 9.7  8.4 - 09.6 mg/dL    GFR calc non Af Amer 26 (*) >90 mL/min   GFR calc Af Amer 31 (*) >90 mL/min  TROPONIN I      Result Value Range   Troponin I <0.30  <0.30 ng/mL  TSH      Result Value Range   TSH 0.894  0.350 - 4.500 uIU/mL  TROPONIN I      Result Value Range   Troponin I <0.30  <0.30 ng/mL  TROPONIN I      Result Value Range   Troponin I <0.30  <0.30 ng/mL  BASIC METABOLIC PANEL      Result Value Range   Sodium 136  135 - 145 mEq/L   Potassium 3.5  3.5 - 5.1 mEq/L   Chloride 102  96 - 112 mEq/L   CO2 22  19 - 32 mEq/L   Glucose, Bld 115 (*) 70 - 99 mg/dL   BUN 31 (*) 6 - 23 mg/dL   Creatinine, Ser 0.45 (*) 0.50 - 1.35 mg/dL   Calcium 9.0  8.4 - 40.9 mg/dL   GFR calc non Af Amer 33 (*) >90 mL/min   GFR calc Af Amer 38 (*) >90 mL/min  CBC      Result Value Range   WBC 6.8  4.0 - 10.5 K/uL   RBC 4.42  4.22 - 5.81 MIL/uL   Hemoglobin 13.9  13.0 - 17.0 g/dL   HCT 81.1  91.4 - 78.2 %   MCV 88.9  78.0 - 100.0 fL   MCH 31.4  26.0 - 34.0 pg   MCHC 35.4  30.0 - 36.0 g/dL   RDW 95.6  21.3 - 08.6 %   Platelets 214  150 - 400 K/uL  URINALYSIS, ROUTINE W REFLEX MICROSCOPIC      Result Value Range   Color, Urine YELLOW  YELLOW   APPearance CLEAR  CLEAR   Specific Gravity, Urine >1.030 (*) 1.005 - 1.030   pH 5.5  5.0 - 8.0   Glucose, UA NEGATIVE  NEGATIVE mg/dL   Hgb urine dipstick NEGATIVE  NEGATIVE   Bilirubin Urine NEGATIVE  NEGATIVE   Ketones, ur NEGATIVE  NEGATIVE mg/dL   Protein, ur NEGATIVE  NEGATIVE mg/dL   Urobilinogen, UA 0.2  0.0 - 1.0 mg/dL   Nitrite NEGATIVE  NEGATIVE   Leukocytes, UA NEGATIVE  NEGATIVE  BASIC METABOLIC PANEL      Result Value Range   Sodium 140  135 - 145 mEq/L   Potassium 4.3  3.5 - 5.1 mEq/L   Chloride 106  96 - 112 mEq/L   CO2 27  19 - 32 mEq/L   Glucose, Bld 93  70 - 99 mg/dL   BUN 22  6 - 23 mg/dL   Creatinine, Ser 5.78 (*) 0.50 - 1.35 mg/dL   Calcium 8.9  8.4 - 46.9 mg/dL   GFR calc non Af Amer 47 (*) >90 mL/min   GFR calc Af Amer 55 (*) >  90  mL/min    EKG Orders placed during the hospital encounter of 01/22/13  . EKG 12-LEAD  . EKG 12-LEAD  . ED EKG  . ED EKG  . EKG 12-LEAD  . EKG 12-LEAD  . EKG     Prior Assessment and Plan Problem List as of 01/26/2013     ICD-9-CM   Acute renal failure   Hypertension   Hyperlipidemia   Stroke   Ventricular tachycardia   Syncope   Tobacco abuse       Imaging: US Renal  01/23/2013  *RADIOLOGY REPORT*  Clinical Data: 47 year old male with acute renal failure. Hypertension.  RENAL/URINARY TRACT ULTRASOUND COMPLETE  Comparison:  None.  Findings:  Right Kidney:  No hydronephrosis.  Cortical echotexture within normal limits.  Renal length 12.0 cm.  Left Kidney:  No hydronephrosis.  Echotexture within normal limits. Renal length 12.1 cm.  Bladder:  Unremarkable.  Other findings:  hepatic steatosis.  IMPRESSION:  1.  No obstructive uropathy. 2.  Hepatic steatosis.   Original Report Authenticated By: Erskine Speed, M.D.    Dg Chest Portable 1 View  01/22/2013  *RADIOLOGY REPORT*  Clinical Data: Chest tightness and dizziness.  Nausea.  Smoker. High blood pressure.  Prior heart attack.  PORTABLE CHEST - 1 VIEW  Comparison: None.  Findings: Mild central pulmonary vascular prominence.  No infiltrate, congestive heart failure or pneumothorax.  Heart size top normal.  IMPRESSION: Mild central pulmonary vascular prominence.  No infiltrate, congestive heart failure or pneumothorax.  Heart size top normal   Original Report Authenticated By: Lacy Duverney, M.D.      [facility] Calculation: Score not calculated. Missing: Total Cholesterol, HDL

## 2013-01-26 NOTE — Assessment & Plan Note (Addendum)
Blood pressure remains normal, likely as a result of recent dehydration and bed rest. He will ultimately require resumption of antihypertensive medication. I am inclined to initially treat with a calcium channel antagonist due to the low likelihood of significant adverse effects with these agents. Patient will monitor blood pressure and call for systolics greater than 160 or diastolic greater than 100 if these develop during the next 2 weeks. Otherwise, we will readjust medication at his next office visit.

## 2013-01-26 NOTE — Assessment & Plan Note (Signed)
No lipid profile available for review.  The importance of further evaluation and treatment of hyperlipidemia will depend upon whether or not vascular disease has been present on previous testing.

## 2013-01-26 NOTE — Assessment & Plan Note (Signed)
Records requested when patient was in hospital, but apparently never received. We will submit the request to [facility] and to the patient's cardiologist in [place].

## 2013-02-02 ENCOUNTER — Encounter: Payer: Self-pay | Admitting: Cardiology

## 2013-02-02 DIAGNOSIS — R079 Chest pain, unspecified: Secondary | ICD-10-CM | POA: Insufficient documentation

## 2013-02-02 DIAGNOSIS — G459 Transient cerebral ischemic attack, unspecified: Secondary | ICD-10-CM | POA: Insufficient documentation

## 2013-02-06 ENCOUNTER — Encounter: Payer: Self-pay | Admitting: Cardiology

## 2013-02-09 ENCOUNTER — Telehealth: Payer: Self-pay | Admitting: *Deleted

## 2013-02-09 MED ORDER — AMLODIPINE BESYLATE 5 MG PO TABS: 5.0000 mg | ORAL_TABLET | Freq: Every day | ORAL | Status: DC

## 2013-02-09 NOTE — Telephone Encounter (Signed)
[name], MD - Blood pressure More Detail >>    Sent: Fri February 06, 2013 10:47 AM    To: Cathren Harsh, RN    Danny Shea    MRN: [id number] DOB: 04-Jun-1966     Pt Home: [phone]          Message    Inadequate control.    Amlodipine 5 mg per day.    Continue home BPs    Blood pressure check in one month.

## 2013-02-09 NOTE — Telephone Encounter (Signed)
Recommendations given to patient.  Verbalizes understanding.  Blood pressure check scheduled for March 24 th.

## 2013-02-16 ENCOUNTER — Encounter: Payer: Self-pay | Admitting: Cardiology

## 2013-02-16 ENCOUNTER — Telehealth: Payer: Self-pay | Admitting: Cardiology

## 2013-02-16 NOTE — Telephone Encounter (Signed)
Please advise 

## 2013-02-16 NOTE — Telephone Encounter (Signed)
Pt states that Dr Felecia Shelling put him on amlodipine 5mg  once a day and losartan 100 mg once a day. This started Feb 18th. His BP has still been 163/105, 151/103.

## 2013-02-17 ENCOUNTER — Encounter: Payer: Self-pay | Admitting: *Deleted

## 2013-02-17 ENCOUNTER — Encounter: Payer: Self-pay | Admitting: Cardiology

## 2013-02-17 ENCOUNTER — Ambulatory Visit (INDEPENDENT_AMBULATORY_CARE_PROVIDER_SITE_OTHER): Payer: Managed Care, Other (non HMO) | Admitting: Cardiology

## 2013-02-17 VITALS — BP 153/95 | HR 89 | Ht 71.0 in | Wt 207.2 lb

## 2013-02-17 DIAGNOSIS — I10 Essential (primary) hypertension: Secondary | ICD-10-CM

## 2013-02-17 DIAGNOSIS — R079 Chest pain, unspecified: Secondary | ICD-10-CM

## 2013-02-17 DIAGNOSIS — I472 Ventricular tachycardia: Secondary | ICD-10-CM

## 2013-02-17 DIAGNOSIS — N179 Acute kidney failure, unspecified: Secondary | ICD-10-CM

## 2013-02-17 DIAGNOSIS — E785 Hyperlipidemia, unspecified: Secondary | ICD-10-CM

## 2013-02-17 DIAGNOSIS — R55 Syncope and collapse: Secondary | ICD-10-CM

## 2013-02-17 MED ORDER — LABETALOL HCL 200 MG PO TABS: 200.0000 mg | ORAL_TABLET | Freq: Two times a day (BID) | ORAL | Status: DC

## 2013-02-17 MED ORDER — AMLODIPINE BESYLATE 10 MG PO TABS: 10.0000 mg | ORAL_TABLET | Freq: Every day | ORAL | Status: DC

## 2013-02-17 NOTE — Telephone Encounter (Signed)
Hypertension will be addressed at upcoming office visit.

## 2013-02-17 NOTE — Assessment & Plan Note (Signed)
Treatment with statin could be considered in the setting of very mild atherosclerosis. In order not to excessively complicate his medical therapy, that decision will be deferred for now.

## 2013-02-17 NOTE — Assessment & Plan Note (Signed)
Blood pressure control is inadequate.  Dose of amlodipine will be increased to 10 mg per day and labetalol introduced and titrated. Patient was reassured that blood pressure elevation of the magnitude he has recently experienced is not immediately harmful.

## 2013-02-17 NOTE — Assessment & Plan Note (Addendum)
We still have inadequate records regarding his course and treatment for ventricular tachycardia. I will contact the electrophysiologist in [place] and seek more detailed information. For now, low-dose sotalol will be continued. Addition of beta blocker for treatment of hypertension may have a salutary effect on his arrhythmia as well.

## 2013-02-17 NOTE — Progress Notes (Signed)
Patient ID: Danny Shea, male   DOB: 24-Aug-1966, 47 y.o.   MRN: [id number]  HPI: Schedule return visit for this very nice gentleman with a history of ventricular tachycardia and hypertension. Prior records were obtained and reviewed, but were incomplete. Patient describes a previous cardiac arrest requiring CPR and subsequent radiofrequency ablation. Arrhythmia apparently recurred with less impressive symptoms prompting treatment with sotalol. Since then, he has been hospitalized for cardiopulmonary symptoms, but apparently has not had documented recurrent ventricular tachycardia.  Cardiac catheterization a few years ago demonstrated minimal coronary disease.  Over the past week, he has experienced difficulties. Blood pressure has been elevated with systolics in the 150-160 range and diastolics of 100 or slightly above, even after losartan was added to his medical regime at a post hospital visit. He associates pressure in this range with headaches. He has also had discomfort along his costal margins and episodes of emesis. He has had substantial anxiety that he characterizes as panic attacks. Benzodiazepines have been prescribed, but has not taken them recently.  Current Outpatient Prescriptions  Medication Sig Dispense Refill  . albuterol (PROVENTIL HFA;VENTOLIN HFA) 108 (90 BASE) MCG/ACT inhaler Inhale 2 puffs into the lungs every 6 (six) hours as needed. For shortness of breath/wheezing      . ALPRAZolam (XANAX) 0.5 MG tablet Take 0.5 mg by mouth at bedtime as needed.       Marland Kitchen amitriptyline (ELAVIL) 25 MG tablet Take 25 mg by mouth at bedtime.      Marland Kitchen amLODipine (NORVASC) 5 MG tablet Take 1 tablet (5 mg total) by mouth daily.  30 tablet  12  . aspirin 325 MG tablet Take 325 mg by mouth daily.       Marland Kitchen atorvastatin (LIPITOR) 40 MG tablet Take 40 mg by mouth daily.      Marland Kitchen imipramine (TOFRANIL) 25 MG tablet Take 25 mg by mouth 3 (three) times daily.      Marland Kitchen LORazepam (ATIVAN) 1 MG tablet Take 1 mg by  mouth every 8 (eight) hours.      Marland Kitchen losartan (COZAAR) 100 MG tablet Take 100 mg by mouth daily.       . nitroGLYCERIN (NITROSTAT) 0.4 MG SL tablet Place 0.4 mg under the tongue every 5 (five) minutes as needed.      Marland Kitchen omeprazole (PRILOSEC) 20 MG capsule Take 20 mg by mouth daily.      Marland Kitchen oxycodone (OXY-IR) 5 MG capsule Take 5 mg by mouth every 4 (four) hours as needed for pain.      Marland Kitchen oxyCODONE-acetaminophen (PERCOCET) 10-325 MG per tablet Take 1 tablet by mouth every 4 (four) hours as needed for pain.      . promethazine (PHENERGAN) 25 MG tablet Take 25 mg by mouth every 8 (eight) hours as needed.       . sotalol (BETAPACE) 80 MG tablet Take 80 mg by mouth 2 (two) times daily. As needed       No current facility-administered medications for this visit.   Allergies  Allergen Reactions  . Darvocet (Propoxyphene-Acetaminophen)     Unknown reaction  . Feldene (Piroxicam)     Unknown reaction     Past medical history, social history, and family history reviewed and updated.  ROS: Denies pedal edema, dyspnea or chest discomfort. All other systems reviewed and are negative.  PHYSICAL EXAM: BP 153/95  Pulse 89  Ht 5\' 11"  (1.803 m)  Wt 94.008 kg (207 lb 4 oz)  BMI 28.92 kg/m2;  Body  mass index is 28.92 kg/(m^2). General-Well developed; no acute distress Body habitus-mildly overweight Neck-No JVD; no carotid bruits Lungs-minor expiratory rhonchi; resonant to percussion Cardiovascular-normal PMI; normal S1 and S2; tachycardic Abdomen-normal bowel sounds; soft and non-tender without masses or organomegaly Musculoskeletal-No deformities, no cyanosis or clubbing Neurologic-Normal cranial nerves; symmetric strength and tone Skin-Warm, no significant lesions Extremities-distal pulses intact; no edema  [place] Bing, MD 02/17/2013  2:22 PM  ASSESSMENT AND PLAN

## 2013-02-17 NOTE — Patient Instructions (Signed)
Your physician recommends that you schedule a follow-up appointment in: 1 month  Your physician has recommended that you wear an event monitor. Event monitors are medical devices that record the heart's electrical activity. Doctors most often [place] these monitors to diagnose arrhythmias. Arrhythmias are problems with the speed or rhythm of the heartbeat. The monitor is a small, portable device. You can wear one while you do your normal daily activities. This is usually used to diagnose what is causing palpitations/syncope (passing out).  Your physician has recommended you make the following change in your medication:  1 - INCREASE Amlodipine to 10 mg daily 2 - START Labetalol 200 mg twice daily  Call us for elevated blood pressures  Your physician recommends that you return for lab work in: Now and and in 1 month

## 2013-02-17 NOTE — Assessment & Plan Note (Signed)
No recurrent chest discomfort. With a history of essentially normal coronary angiography in 12/2010, I doubt this represents myocardial ischemia.

## 2013-02-17 NOTE — Progress Notes (Deleted)
Name: Danny Shea    DOB: 1966/11/20  Age: 47 y.o.  MR#: [id number]       PCP:  Kendell Bane, MD      Insurance: Payor: Monia Pouch  Plan: AETNA MANAGED  Product Type: *No Product type*    CC:   PT NOTED ELEVATED BP OF 163/105 THIS PAST WEEKEND, 151/106 YESTERDAY, ADVISES HE IS TAKING BP MEDICATIONS AS DIRECTED, REDUCED SMOKING TO HALF A PACK FOR THE PAST 3 MONTHS, BROUGHT IN BOTTLES   VS Filed Vitals:   02/17/13 1337  BP: 153/95  Pulse: 89  Height: 5\' 11"  (1.803 m)  Weight: 207 lb 4 oz (94.008 kg)    Weights Current Weight  02/17/13 207 lb 4 oz (94.008 kg)  01/26/13 211 lb (95.709 kg)  01/22/13 210 lb (95.255 kg)    Blood Pressure  BP Readings from Last 3 Encounters:  02/17/13 153/95  01/26/13 142/85  01/24/13 105/57     Admit date:  (Not on file) Last encounter with RMR:  02/16/2013   Allergy Darvocet and Feldene  Current Outpatient Prescriptions  Medication Sig Dispense Refill  . albuterol (PROVENTIL HFA;VENTOLIN HFA) 108 (90 BASE) MCG/ACT inhaler Inhale 2 puffs into the lungs every 6 (six) hours as needed. For shortness of breath/wheezing      . ALPRAZolam (XANAX) 0.5 MG tablet Take 0.5 mg by mouth at bedtime as needed.       Marland Kitchen amitriptyline (ELAVIL) 25 MG tablet Take 25 mg by mouth at bedtime.      Marland Kitchen amLODipine (NORVASC) 5 MG tablet Take 1 tablet (5 mg total) by mouth daily.  30 tablet  12  . aspirin 325 MG tablet Take 325 mg by mouth daily.       Marland Kitchen atorvastatin (LIPITOR) 40 MG tablet Take 40 mg by mouth daily.      Marland Kitchen imipramine (TOFRANIL) 25 MG tablet Take 25 mg by mouth 3 (three) times daily.      Marland Kitchen LORazepam (ATIVAN) 1 MG tablet Take 1 mg by mouth every 8 (eight) hours.      Marland Kitchen losartan (COZAAR) 100 MG tablet Take 100 mg by mouth daily.       . nitroGLYCERIN (NITROSTAT) 0.4 MG SL tablet Place 0.4 mg under the tongue every 5 (five) minutes as needed.      Marland Kitchen omeprazole (PRILOSEC) 20 MG capsule Take 20 mg by mouth daily.      Marland Kitchen oxycodone (OXY-IR) 5 MG capsule Take 5 mg  by mouth every 4 (four) hours as needed for pain.      Marland Kitchen oxyCODONE-acetaminophen (PERCOCET) 10-325 MG per tablet Take 1 tablet by mouth every 4 (four) hours as needed for pain.      . promethazine (PHENERGAN) 25 MG tablet Take 25 mg by mouth every 8 (eight) hours as needed.       . sotalol (BETAPACE) 80 MG tablet Take 80 mg by mouth 2 (two) times daily. As needed       No current facility-administered medications for this visit.    Discontinued Meds:    Medications Discontinued During This Encounter  Medication Reason  . methocarbamol (ROBAXIN) 500 MG tablet Error    Patient Active Problem List  Diagnosis  . Acute renal failure  . Hypertension  . Hyperlipidemia  . Stroke  . Ventricular tachycardia  . Syncope  . Tobacco abuse  . Chest pain  . TIA (transient ischemic attack)    LABS    Component Value Date/Time   NA  140 01/24/2013 0551   NA 136 01/23/2013 0317   NA 137 01/22/2013 1534   K 4.3 01/24/2013 0551   K 3.5 01/23/2013 0317   K 3.9 01/22/2013 1534   CL 106 01/24/2013 0551   CL 102 01/23/2013 0317   CL 101 01/22/2013 1534   CO2 27 01/24/2013 0551   CO2 22 01/23/2013 0317   CO2 24 01/22/2013 1534   GLUCOSE 93 01/24/2013 0551   GLUCOSE 115* 01/23/2013 0317   GLUCOSE 98 01/22/2013 1534   BUN 22 01/24/2013 0551   BUN 31* 01/23/2013 0317   BUN 32* 01/22/2013 1534   CREATININE 1.66* 01/24/2013 0551   CREATININE 2.23* 01/23/2013 0317   CREATININE 2.68* 01/22/2013 1534   CALCIUM 8.9 01/24/2013 0551   CALCIUM 9.0 01/23/2013 0317   CALCIUM 9.7 01/22/2013 1534   GFRNONAA 47* 01/24/2013 0551   GFRNONAA 33* 01/23/2013 0317   GFRNONAA 26* 01/22/2013 1534   GFRAA 55* 01/24/2013 0551   GFRAA 38* 01/23/2013 0317   GFRAA 31* 01/22/2013 1534   CMP     Component Value Date/Time   NA 140 01/24/2013 0551   K 4.3 01/24/2013 0551   CL 106 01/24/2013 0551   CO2 27 01/24/2013 0551   GLUCOSE 93 01/24/2013 0551   BUN 22 01/24/2013 0551   CREATININE 1.66* 01/24/2013 0551   CALCIUM 8.9 01/24/2013 0551   GFRNONAA 47* 01/24/2013 0551   GFRAA 55*  01/24/2013 0551       Component Value Date/Time   WBC 6.8 01/23/2013 0317   WBC 7.8 01/22/2013 1534   HGB 13.9 01/23/2013 0317   HGB 14.8 01/22/2013 1534   HCT 39.3 01/23/2013 0317   HCT 42.0 01/22/2013 1534   MCV 88.9 01/23/2013 0317   MCV 89.0 01/22/2013 1534    Lipid Panel  No results found for this basename: chol, trig, hdl, cholhdl, vldl, ldlcalc    ABG No results found for this basename: phart, pco2, pco2art, po2, po2art, hco3, tco2, acidbasedef, o2sat     Lab Results  Component Value Date   TSH 0.894 01/22/2013   BNP (last 3 results) No results found for this basename: PROBNP,  in the last 8760 hours Cardiac Panel (last 3 results) No results found for this basename: CKTOTAL, CKMB, TROPONINI, RELINDX,  in the last 72 hours  Iron/TIBC/Ferritin No results found for this basename: iron, tibc, ferritin     EKG Orders placed during the hospital encounter of 01/22/13  . EKG 12-LEAD  . EKG 12-LEAD  . ED EKG  . ED EKG  . EKG 12-LEAD  . EKG 12-LEAD  . EKG     Prior Assessment and Plan Problem List as of 02/17/2013     ICD-9-CM   Acute renal failure   Last Assessment & Plan   01/26/2013 Office Visit Written 01/26/2013  5:00 PM by [name], MD     Creatinine had decreased to 1.66 at the time of hospital discharge 2 days ago. Patient's PCP is following and treating this problem, but I suspect BUN and creatinine will return to entirely normal levels.    Hypertension   Last Assessment & Plan   01/26/2013 Office Visit Edited 01/27/2013  1:59 PM by [name], MD     Blood pressure remains normal, likely as a result of recent dehydration and bed rest. He will ultimately require resumption of antihypertensive medication. I am inclined to initially treat with a calcium channel antagonist due to the low likelihood of significant adverse  effects with these agents. Patient will monitor blood pressure and call for systolics greater than 160 or diastolic greater than 100 if these develop  during the next 2 weeks. Otherwise, we will readjust medication at his next office visit.    Hyperlipidemia   Last Assessment & Plan   01/26/2013 Office Visit Written 01/26/2013  5:02 PM by [name], MD     No lipid profile available for review.  The importance of further evaluation and treatment of hyperlipidemia will depend upon whether or not vascular disease has been present on previous testing.    Stroke   Ventricular tachycardia   Last Assessment & Plan   01/26/2013 Office Visit Written 01/26/2013  5:04 PM by [name], MD     Records requested when patient was in hospital, but apparently never received. We will submit the request to [facility] and to the patient's cardiologist in [place].    Syncope   Tobacco abuse   Last Assessment & Plan   01/26/2013 Office Visit Written 01/26/2013  5:03 PM by [name], MD     Patient encouraged to discontinue tobacco use entirely. He indicates that this is his plan.    Chest pain   TIA (transient ischemic attack)       Imaging: US Renal  01/23/2013  *RADIOLOGY REPORT*  Clinical Data: 47 year old male with acute renal failure. Hypertension.  RENAL/URINARY TRACT ULTRASOUND COMPLETE  Comparison:  None.  Findings:  Right Kidney:  No hydronephrosis.  Cortical echotexture within normal limits.  Renal length 12.0 cm.  Left Kidney:  No hydronephrosis.  Echotexture within normal limits. Renal length 12.1 cm.  Bladder:  Unremarkable.  Other findings:  hepatic steatosis.  IMPRESSION:  1.  No obstructive uropathy. 2.  Hepatic steatosis.   Original Report Authenticated By: Erskine Speed, M.D.    Dg Chest Portable 1 View  01/22/2013  *RADIOLOGY REPORT*  Clinical Data: Chest tightness and dizziness.  Nausea.  Smoker. High blood pressure.  Prior heart attack.  PORTABLE CHEST - 1 VIEW  Comparison: None.  Findings: Mild central pulmonary vascular prominence.  No infiltrate, congestive heart failure or pneumothorax.  Heart size  top normal.  IMPRESSION: Mild central pulmonary vascular prominence.  No infiltrate, congestive heart failure or pneumothorax.  Heart size top normal   Original Report Authenticated By: Lacy Duverney, M.D.

## 2013-02-17 NOTE — Assessment & Plan Note (Signed)
Renal function has likely reverted to normal. A repeat chemistry profile will be obtained now and in one month.

## 2013-02-18 ENCOUNTER — Telehealth: Payer: Self-pay | Admitting: Cardiology

## 2013-02-18 LAB — BASIC METABOLIC PANEL
BUN: 17 mg/dL (ref 6–23)
Calcium: 9.9 mg/dL (ref 8.4–10.5)
Chloride: 107 mEq/L (ref 96–112)
Creat: 1.08 mg/dL (ref 0.50–1.35)

## 2013-02-18 NOTE — Telephone Encounter (Signed)
Dr Felecia Shelling has pt on some medications and he wants to know if he should discontinue them now that dr Dietrich Pates has started new medications.   Plus he would like labs results

## 2013-02-18 NOTE — Telephone Encounter (Signed)
Advised patient to take all medications on list provided to him at [facility] yesterday. Lab results normal.

## 2013-02-23 ENCOUNTER — Telehealth: Payer: Self-pay | Admitting: Cardiology

## 2013-02-23 NOTE — Telephone Encounter (Signed)
Pt informed of normal labs.

## 2013-02-23 NOTE — Telephone Encounter (Signed)
Message copied by Burnice Logan on Mon Feb 23, 2013  3:35 PM ------      Message from: [name]      Created: Thu Feb 19, 2013 11:22 AM       Lab results reviewed.  No significant abnormalities      No change in Rx. ------

## 2013-03-03 ENCOUNTER — Telehealth: Payer: Self-pay | Admitting: *Deleted

## 2013-03-03 NOTE — Telephone Encounter (Signed)
Wife presented to the office with pictures of patient's extremely swollen feet and ankles.  Patient is in [place] and is refusing to go to the ER without speaking with [place].  Complaints of back pain and pitting edema.  Spoke with the patient and advised him to seek medical attention, as we could not address this remotely.  Verbalized understanding and states he will be seen.

## 2013-03-05 ENCOUNTER — Ambulatory Visit (INDEPENDENT_AMBULATORY_CARE_PROVIDER_SITE_OTHER): Payer: Managed Care, Other (non HMO) | Admitting: Cardiology

## 2013-03-05 ENCOUNTER — Encounter: Payer: Self-pay | Admitting: Cardiology

## 2013-03-05 VITALS — BP 166/84 | HR 74 | Ht 71.0 in | Wt 226.0 lb

## 2013-03-05 DIAGNOSIS — N179 Acute kidney failure, unspecified: Secondary | ICD-10-CM

## 2013-03-05 DIAGNOSIS — Z720 Tobacco use: Secondary | ICD-10-CM

## 2013-03-05 DIAGNOSIS — I10 Essential (primary) hypertension: Secondary | ICD-10-CM

## 2013-03-05 DIAGNOSIS — R079 Chest pain, unspecified: Secondary | ICD-10-CM

## 2013-03-05 DIAGNOSIS — F17200 Nicotine dependence, unspecified, uncomplicated: Secondary | ICD-10-CM

## 2013-03-05 MED ORDER — CLONIDINE HCL 0.2 MG/24HR TD PTWK: 1.0000 | MEDICATED_PATCH | TRANSDERMAL | Status: DC

## 2013-03-05 MED ORDER — FUROSEMIDE 40 MG PO TABS: 40.0000 mg | ORAL_TABLET | Freq: Two times a day (BID) | ORAL | Status: DC

## 2013-03-05 NOTE — Progress Notes (Signed)
Patient ID: Danny Shea, male   DOB: 03-08-1966, 47 y.o.   MRN: [id number]  HPI: Requested return visit for this very nice gentleman with hypertension after evaluation in emergency department in [place] for fluid retention. Patient reports gradual development of edema. He has noted dyspnea for the past few weeks with mild to moderate exertion. There is been no orthopnea nor PND. There has been an increase in abdominal girth and apparent swelling of the soft tissues including the eyelids. He was given furosemide intravenously 2 days ago with a marked diuresis but no real improvement.  He reports that CT of the chest, chest x-ray, basic lab tests and ultrasound of the lower extremities were all normal. He has had no urinary problems, but may have noted a decrease in the strength of his stream. Current Outpatient Prescriptions  Medication Sig Dispense Refill  . albuterol (PROVENTIL HFA;VENTOLIN HFA) 108 (90 BASE) MCG/ACT inhaler Inhale 2 puffs into the lungs every 6 (six) hours as needed. For shortness of breath/wheezing      . ALPRAZolam (XANAX) 0.5 MG tablet Take 0.5 mg by mouth at bedtime as needed.       Marland Kitchen amitriptyline (ELAVIL) 25 MG tablet Take 25 mg by mouth at bedtime.      Marland Kitchen amLODipine (NORVASC) 10 MG tablet Take 1 tablet (10 mg total) by mouth daily.  30 tablet  6  . aspirin 325 MG tablet Take 325 mg by mouth daily.       Marland Kitchen atorvastatin (LIPITOR) 40 MG tablet Take 40 mg by mouth daily.      Marland Kitchen imipramine (TOFRANIL) 25 MG tablet Take 25 mg by mouth 3 (three) times daily.      Marland Kitchen labetalol (NORMODYNE) 200 MG tablet Take 1 tablet (200 mg total) by mouth 2 (two) times daily.  60 tablet  6  . LORazepam (ATIVAN) 1 MG tablet Take 1 mg by mouth every 8 (eight) hours.      Marland Kitchen losartan (COZAAR) 100 MG tablet Take 100 mg by mouth daily.       . nitroGLYCERIN (NITROSTAT) 0.4 MG SL tablet Place 0.4 mg under the tongue every 5 (five) minutes as needed.      Marland Kitchen omeprazole (PRILOSEC) 20 MG capsule Take 20 mg  by mouth daily.      Marland Kitchen oxycodone (OXY-IR) 5 MG capsule Take 5 mg by mouth every 4 (four) hours as needed for pain.      Marland Kitchen oxyCODONE-acetaminophen (PERCOCET) 10-325 MG per tablet Take 1 tablet by mouth every 4 (four) hours as needed for pain.      . promethazine (PHENERGAN) 25 MG tablet Take 25 mg by mouth every 8 (eight) hours as needed.       . sotalol (BETAPACE) 80 MG tablet Take 80 mg by mouth 2 (two) times daily. As needed       No current facility-administered medications for this visit.   Allergies  Allergen Reactions  . Darvocet (Propoxyphene-Acetaminophen)     Unknown reaction  . Feldene (Piroxicam)     Unknown reaction     Past medical history, social history, and family history reviewed and updated.  ROS: See history of present illness. All other systems reviewed and are negative.  PHYSICAL EXAM: BP 166/84  Pulse 74  Ht 5\' 11"  (1.803 m)  Wt 102.513 kg (226 lb)  BMI 31.53 kg/m2  SpO2 97%; weight increased 19 pounds since 16 days ago. Body mass index is 31.53 kg/(m^2). General-Well developed; no acute distress  Body habitus-mildly to moderately overweight Neck-No JVD; no carotid bruits Lungs-clear lung fields; resonant to percussion Cardiovascular-normal PMI; normal S1 and S2 Abdomen-mildly to moderately distended; normal bowel sounds; soft and non-tender without masses or organomegaly; no dullness to percussion Musculoskeletal-No deformities, no cyanosis or clubbing Neurologic-Normal cranial nerves; symmetric strength and tone Skin-Warm, no significant lesions Extremities-distal pulses intact; 1-2+ ankle and pretibial edema  Ragan Bing, MD 03/05/2013  3:23 PM  ASSESSMENT AND PLAN

## 2013-03-05 NOTE — Assessment & Plan Note (Signed)
Repeat creatinine was normal on 02/17/2013 and apparently in the emergency department 2 days ago. Renal disease does not appear to account for his current fluid retention.

## 2013-03-05 NOTE — Progress Notes (Deleted)
Name: Danny Shea    DOB: Feb 22, 1966  Age: 47 y.o.  MR#: [id number]       PCP:  Dwana Melena, MD      Insurance: Payor: AETNA  Plan: AETNA MANAGED  Product Type: *No Product type*    CC:   No chief complaint on file.   VS Filed Vitals:   03/05/13 1450  BP: 166/84  Pulse: 74  Height: 5\' 11"  (1.803 m)  Weight: 226 lb (102.513 kg)  SpO2: 97%    Weights Current Weight  03/05/13 226 lb (102.513 kg)  02/17/13 207 lb 4 oz (94.008 kg)  01/26/13 211 lb (95.709 kg)    Blood Pressure  BP Readings from Last 3 Encounters:  03/05/13 166/84  02/17/13 153/95  01/26/13 142/85     Admit date:  (Not on file) Last encounter with RMR:  02/18/2013   Allergy Darvocet and Feldene  Current Outpatient Prescriptions  Medication Sig Dispense Refill  . albuterol (PROVENTIL HFA;VENTOLIN HFA) 108 (90 BASE) MCG/ACT inhaler Inhale 2 puffs into the lungs every 6 (six) hours as needed. For shortness of breath/wheezing      . ALPRAZolam (XANAX) 0.5 MG tablet Take 0.5 mg by mouth at bedtime as needed.       Marland Kitchen amitriptyline (ELAVIL) 25 MG tablet Take 25 mg by mouth at bedtime.      Marland Kitchen amLODipine (NORVASC) 10 MG tablet Take 1 tablet (10 mg total) by mouth daily.  30 tablet  6  . aspirin 325 MG tablet Take 325 mg by mouth daily.       Marland Kitchen atorvastatin (LIPITOR) 40 MG tablet Take 40 mg by mouth daily.      Marland Kitchen imipramine (TOFRANIL) 25 MG tablet Take 25 mg by mouth 3 (three) times daily.      Marland Kitchen labetalol (NORMODYNE) 200 MG tablet Take 1 tablet (200 mg total) by mouth 2 (two) times daily.  60 tablet  6  . LORazepam (ATIVAN) 1 MG tablet Take 1 mg by mouth every 8 (eight) hours.      Marland Kitchen losartan (COZAAR) 100 MG tablet Take 100 mg by mouth daily.       . nitroGLYCERIN (NITROSTAT) 0.4 MG SL tablet Place 0.4 mg under the tongue every 5 (five) minutes as needed.      Marland Kitchen omeprazole (PRILOSEC) 20 MG capsule Take 20 mg by mouth daily.      Marland Kitchen oxycodone (OXY-IR) 5 MG capsule Take 5 mg by mouth every 4 (four) hours as needed for  pain.      Marland Kitchen oxyCODONE-acetaminophen (PERCOCET) 10-325 MG per tablet Take 1 tablet by mouth every 4 (four) hours as needed for pain.      . promethazine (PHENERGAN) 25 MG tablet Take 25 mg by mouth every 8 (eight) hours as needed.       . sotalol (BETAPACE) 80 MG tablet Take 80 mg by mouth 2 (two) times daily. As needed       No current facility-administered medications for this visit.    Discontinued Meds:   There are no discontinued medications.  Patient Active Problem List  Diagnosis  . Acute renal failure  . Hypertension  . Hyperlipidemia  . Stroke  . Ventricular tachycardia  . Syncope  . Tobacco abuse  . Chest pain  . TIA (transient ischemic attack)    LABS    Component Value Date/Time   NA 142 02/17/2013 1435   NA 140 01/24/2013 0551   NA 136 01/23/2013 0317   K  3.8 02/17/2013 1435   K 4.3 01/24/2013 0551   K 3.5 01/23/2013 0317   CL 107 02/17/2013 1435   CL 106 01/24/2013 0551   CL 102 01/23/2013 0317   CO2 26 02/17/2013 1435   CO2 27 01/24/2013 0551   CO2 22 01/23/2013 0317   GLUCOSE 110* 02/17/2013 1435   GLUCOSE 93 01/24/2013 0551   GLUCOSE 115* 01/23/2013 0317   BUN 17 02/17/2013 1435   BUN 22 01/24/2013 0551   BUN 31* 01/23/2013 0317   CREATININE 1.08 02/17/2013 1435   CREATININE 1.66* 01/24/2013 0551   CREATININE 2.23* 01/23/2013 0317   CREATININE 2.68* 01/22/2013 1534   CALCIUM 9.9 02/17/2013 1435   CALCIUM 8.9 01/24/2013 0551   CALCIUM 9.0 01/23/2013 0317   GFRNONAA 47* 01/24/2013 0551   GFRNONAA 33* 01/23/2013 0317   GFRNONAA 26* 01/22/2013 1534   GFRAA 55* 01/24/2013 0551   GFRAA 38* 01/23/2013 0317   GFRAA 31* 01/22/2013 1534   CMP     Component Value Date/Time   NA 142 02/17/2013 1435   K 3.8 02/17/2013 1435   CL 107 02/17/2013 1435   CO2 26 02/17/2013 1435   GLUCOSE 110* 02/17/2013 1435   BUN 17 02/17/2013 1435   CREATININE 1.08 02/17/2013 1435   CREATININE 1.66* 01/24/2013 0551   CALCIUM 9.9 02/17/2013 1435   GFRNONAA 47* 01/24/2013 0551   GFRAA 55* 01/24/2013 0551       Component Value Date/Time   WBC  6.8 01/23/2013 0317   WBC 7.8 01/22/2013 1534   HGB 13.9 01/23/2013 0317   HGB 14.8 01/22/2013 1534   HCT 39.3 01/23/2013 0317   HCT 42.0 01/22/2013 1534   MCV 88.9 01/23/2013 0317   MCV 89.0 01/22/2013 1534    Lipid Panel  No results found for this basename: chol, trig, hdl, cholhdl, vldl, ldlcalc    ABG No results found for this basename: phart, pco2, pco2art, po2, po2art, hco3, tco2, acidbasedef, o2sat     Lab Results  Component Value Date   TSH 0.894 01/22/2013   BNP (last 3 results) No results found for this basename: PROBNP,  in the last 8760 hours Cardiac Panel (last 3 results) No results found for this basename: CKTOTAL, CKMB, TROPONINI, RELINDX,  in the last 72 hours  Iron/TIBC/Ferritin No results found for this basename: iron, tibc, ferritin     EKG Orders placed in visit on 02/17/13  . CARDIAC EVENT MONITOR  . CARDIAC EVENT MONITOR     Prior Assessment and Plan Problem List as of 03/05/2013     ICD-9-CM     Cardiology Problems   Hypertension   Last Assessment & Plan   02/17/2013 Office Visit Written 02/17/2013  2:30 PM by [name], MD     Blood pressure control is inadequate.  Dose of amlodipine will be increased to 10 mg per day and labetalol introduced and titrated. Patient was reassured that blood pressure elevation of the magnitude he has recently experienced is not immediately harmful.    Hyperlipidemia   Last Assessment & Plan   02/17/2013 Office Visit Written 02/17/2013  2:29 PM by [name], MD     Treatment with statin could be considered in the setting of very mild atherosclerosis. In order not to excessively complicate his medical therapy, that decision will be deferred for now.    Stroke   Ventricular tachycardia   Last Assessment & Plan   02/17/2013 Office Visit Edited 02/20/2013  2:07 PM by Gerrit Friends  Rothbart, MD     We still have inadequate records regarding his course and treatment for ventricular tachycardia. I will contact the electrophysiologist in  [place] and seek more detailed information. For now, low-dose sotalol will be continued. Addition of beta blocker for treatment of hypertension may have a salutary effect on his arrhythmia as well.    Syncope   TIA (transient ischemic attack)     Other   Acute renal failure   Last Assessment & Plan   02/17/2013 Office Visit Written 02/17/2013  2:27 PM by [name], MD     Renal function has likely reverted to normal. A repeat chemistry profile will be obtained now and in one month.    Tobacco abuse   Last Assessment & Plan   01/26/2013 Office Visit Written 01/26/2013  5:03 PM by [name], MD     Patient encouraged to discontinue tobacco use entirely. He indicates that this is his plan.    Chest pain   Last Assessment & Plan   02/17/2013 Office Visit Written 02/17/2013  2:28 PM by [name], MD     No recurrent chest discomfort. With a history of essentially normal coronary angiography in 12/2010, I doubt this represents myocardial ischemia.        Imaging: No results found.

## 2013-03-05 NOTE — Assessment & Plan Note (Signed)
Patient reports continued reduced consumption of tobacco, but is eager to quit entirely. I suggested we address this problem after blood pressure and fluid retention have been controlled.

## 2013-03-05 NOTE — Patient Instructions (Addendum)
Your physician recommends that you schedule a follow-up appointment in: Mid next week  Your physician recommends that you weigh, daily, at the same time every day, and in the same amount of clothing. Please record your daily weights on the handout provided and bring it to your next appointment.  Low salt diet  Call us tomorrow and let us know how you are doing  Your physician has recommended you make the following change in your medication:  1 - Lasix 40 mg twice a day 2 - STOP Amlodipine 3 - Clonidine patch weekly

## 2013-03-05 NOTE — Assessment & Plan Note (Addendum)
Blood pressure control remains suboptimal. Development of apparent congestive heart failure suggests the possibility of hypertensive heart disease. Exertional dyspnea could reflect myocardial ischemia, but coronary artery disease was not present at catheterization just a few years ago.  He has questionable symptoms of prostatism, but reports no apparent change in urine output or frequency of urination, which does not support the possibility of urinary obstruction causing his current problems. Amlodipine certainly could be contributing to edema, but I have not previously seen this much fluid retention related to amlodipine. In any case, that medication will be stopped. Clonidine TTS will be added as well his furosemide with an initial dose of 80 mg and then 40 mg twice a day. Patient will record daily weights and call us tomorrow and Monday to report his status. Followup will be next week.

## 2013-03-06 ENCOUNTER — Ambulatory Visit: Payer: Managed Care, Other (non HMO) | Admitting: Adult Health

## 2013-03-10 ENCOUNTER — Ambulatory Visit: Payer: Managed Care, Other (non HMO) | Admitting: Adult Health

## 2013-03-13 ENCOUNTER — Other Ambulatory Visit: Payer: Self-pay | Admitting: *Deleted

## 2013-03-13 DIAGNOSIS — E785 Hyperlipidemia, unspecified: Secondary | ICD-10-CM

## 2013-03-13 DIAGNOSIS — N179 Acute kidney failure, unspecified: Secondary | ICD-10-CM

## 2013-03-13 DIAGNOSIS — I472 Ventricular tachycardia: Secondary | ICD-10-CM

## 2013-03-13 DIAGNOSIS — R079 Chest pain, unspecified: Secondary | ICD-10-CM

## 2013-03-13 DIAGNOSIS — I10 Essential (primary) hypertension: Secondary | ICD-10-CM

## 2013-03-14 ENCOUNTER — Other Ambulatory Visit: Payer: Self-pay | Admitting: Cardiology

## 2013-03-16 ENCOUNTER — Telehealth: Payer: Self-pay | Admitting: Cardiology

## 2013-03-16 NOTE — Telephone Encounter (Signed)
Spoke with Dr Dietrich Pates, who has the lab work from hospital in [place], which is normal.  States he may come next week for his appointment.  Wife notified and patient to call for appt.

## 2013-03-16 NOTE — Telephone Encounter (Signed)
PT WIFE IS CALLING TO FIND OUT WHY HE NEEDS TO COME IN ON Friday. 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  IF IT IS JUST TO GO OVER INFORMATION OR IF HE NEEDS TO PLAN ON STAYING TO HAVE MORE TESTING DONE.  THEY JUST NEEDS TO KNOW WHAT TO PLAN FOR THE FOLLOWING WEEK FOR HIS WORKERS/[employer]

## 2013-03-16 NOTE — Telephone Encounter (Signed)
Spoke to patient's wife.  After a lengthy conversation regarding how difficult it is for him to make appointments, as he is responsible for a crew of men and needs to know if this appointment is necessary.   Advised her that Dr Dietrich Pates had instructed me to contact him regarding an appointment for this week, as his appt from last week was cancelled due to provider illness, which we had no control over.  She stated that she would talk to him due to the fact that she does not know when it would be convenient for him to come in.

## 2013-03-19 ENCOUNTER — Telehealth: Payer: Self-pay | Admitting: Cardiology

## 2013-03-19 DIAGNOSIS — I10 Essential (primary) hypertension: Secondary | ICD-10-CM

## 2013-03-19 NOTE — Telephone Encounter (Signed)
Contacted patient with appointment for April 10 th at 3:15

## 2013-03-19 NOTE — Telephone Encounter (Signed)
Patient called on 03/13/2013 after I discovered that laboratories previously requested not been performed and that patient's return office visit had been canceled. He reported feeling fine, working in [place], [place] and having achieved a 10 pound weight loss with diuretic therapy and discontinuation of amlodipine. He was arranged for him to obtain a metabolic profile at his local hospital, but somehow he diverted to the emergency department where he was seen and multiple additional studies ordered. Lab results were faxed to me and were good with a normal CBC, normal Bmet including a potassium of 3.6, normal troponin and normal BNP of 61. I advised him to decrease furosemide to 40 mg per day and that we would provide him with a potassium enriched diet at his next office visit. He was advised to schedule this as soon as he returns to [place].

## 2013-03-20 ENCOUNTER — Ambulatory Visit: Payer: Managed Care, Other (non HMO) | Admitting: Adult Health

## 2013-03-24 ENCOUNTER — Encounter: Payer: Self-pay | Admitting: Cardiology

## 2013-03-26 ENCOUNTER — Ambulatory Visit: Payer: Managed Care, Other (non HMO) | Admitting: Cardiology

## 2013-04-07 ENCOUNTER — Ambulatory Visit: Payer: Managed Care, Other (non HMO) | Admitting: Cardiology

## 2013-05-20 ENCOUNTER — Encounter: Payer: Self-pay | Admitting: Cardiology

## 2013-06-12 ENCOUNTER — Telehealth: Payer: Self-pay | Admitting: Cardiology

## 2013-06-12 NOTE — Telephone Encounter (Signed)
Please advise alternative

## 2013-06-12 NOTE — Telephone Encounter (Signed)
Patient states that he is on Clonidine patches and he sweats a lot at his job.  States that the patches keep falling off.  Wants to know if he can be switched to something else. / tgs

## 2013-06-20 NOTE — Telephone Encounter (Signed)
Please discuss with pharmacist and the medical information division of the pharmaceutical company that manufactures this agent.  They should be able to provide suggestions as to how to minimize this problem. There really is no medication equivalent to transdermal clonidine, and would like to continue it if possible.

## 2013-06-26 NOTE — Telephone Encounter (Signed)
Late entry: 7: 6:14 .left message to have patient return my call. Spoke to [place] with [facility] that advised pt could possibly use surgical tape, unable to provide contact number for manufacturer, left message for pt to update current suggestion, .left message to have patient return my call.

## 2013-07-01 ENCOUNTER — Telehealth: Payer: Self-pay | Admitting: *Deleted

## 2013-07-01 NOTE — Telephone Encounter (Signed)
Pt was informed to try surgical tape per phone note on 06-12-13.

## 2013-07-01 NOTE — Telephone Encounter (Signed)
Pt was informed to try the surgical tape per HD via telephone Pt will call back to our office with any further concerns if necessary

## 2013-07-01 NOTE — Telephone Encounter (Signed)
PT is having a hard time with BP because the patch he is on for it comes off when he sweats. What should he do?

## 2013-07-03 ENCOUNTER — Encounter (HOSPITAL_COMMUNITY): Payer: Self-pay

## 2013-07-03 ENCOUNTER — Emergency Department (HOSPITAL_COMMUNITY)
Admission: EM | Admit: 2013-07-03 | Discharge: 2013-07-03 | Disposition: A | Discharge status: Home / Self Care | Payer: Managed Care, Other (non HMO) | Attending: Emergency Medicine | Admitting: Emergency Medicine

## 2013-07-03 ENCOUNTER — Emergency Department (HOSPITAL_COMMUNITY): Payer: Managed Care, Other (non HMO)

## 2013-07-03 DIAGNOSIS — Z8614 Personal history of Methicillin resistant Staphylococcus aureus infection: Secondary | ICD-10-CM | POA: Insufficient documentation

## 2013-07-03 DIAGNOSIS — R093 Abnormal sputum: Secondary | ICD-10-CM | POA: Insufficient documentation

## 2013-07-03 DIAGNOSIS — R42 Dizziness and giddiness: Secondary | ICD-10-CM | POA: Insufficient documentation

## 2013-07-03 DIAGNOSIS — R5383 Other fatigue: Secondary | ICD-10-CM | POA: Insufficient documentation

## 2013-07-03 DIAGNOSIS — I10 Essential (primary) hypertension: Secondary | ICD-10-CM | POA: Insufficient documentation

## 2013-07-03 DIAGNOSIS — F17200 Nicotine dependence, unspecified, uncomplicated: Secondary | ICD-10-CM | POA: Insufficient documentation

## 2013-07-03 DIAGNOSIS — Z8739 Personal history of other diseases of the musculoskeletal system and connective tissue: Secondary | ICD-10-CM | POA: Insufficient documentation

## 2013-07-03 DIAGNOSIS — Z7982 Long term (current) use of aspirin: Secondary | ICD-10-CM | POA: Insufficient documentation

## 2013-07-03 DIAGNOSIS — Z0189 Encounter for other specified special examinations: Secondary | ICD-10-CM

## 2013-07-03 DIAGNOSIS — M549 Dorsalgia, unspecified: Secondary | ICD-10-CM | POA: Insufficient documentation

## 2013-07-03 DIAGNOSIS — F3289 Other specified depressive episodes: Secondary | ICD-10-CM | POA: Insufficient documentation

## 2013-07-03 DIAGNOSIS — Z8673 Personal history of transient ischemic attack (TIA), and cerebral infarction without residual deficits: Secondary | ICD-10-CM | POA: Insufficient documentation

## 2013-07-03 DIAGNOSIS — F329 Major depressive disorder, single episode, unspecified: Secondary | ICD-10-CM | POA: Insufficient documentation

## 2013-07-03 DIAGNOSIS — Z8679 Personal history of other diseases of the circulatory system: Secondary | ICD-10-CM | POA: Insufficient documentation

## 2013-07-03 DIAGNOSIS — E785 Hyperlipidemia, unspecified: Secondary | ICD-10-CM | POA: Insufficient documentation

## 2013-07-03 DIAGNOSIS — R5381 Other malaise: Secondary | ICD-10-CM | POA: Insufficient documentation

## 2013-07-03 DIAGNOSIS — G8929 Other chronic pain: Secondary | ICD-10-CM | POA: Insufficient documentation

## 2013-07-03 DIAGNOSIS — Z79899 Other long term (current) drug therapy: Secondary | ICD-10-CM | POA: Insufficient documentation

## 2013-07-03 HISTORY — DX: Dorsalgia, unspecified: M54.9

## 2013-07-03 HISTORY — DX: Other chronic pain: G89.29

## 2013-07-03 LAB — BASIC METABOLIC PANEL
BUN: 23 mg/dL (ref 6–23)
CO2: 25 mEq/L (ref 19–32)
Calcium: 9.7 mg/dL (ref 8.4–10.5)
Chloride: 104 mEq/L (ref 96–112)
Creatinine, Ser: 1.3 mg/dL (ref 0.50–1.35)
Glucose, Bld: 126 mg/dL — ABNORMAL HIGH (ref 70–99)

## 2013-07-03 LAB — TROPONIN I: Troponin I: <0.3 ng/mL (ref <0.30)

## 2013-07-03 LAB — URINALYSIS W MICROSCOPIC + REFLEX CULTURE
Bilirubin Urine: NEGATIVE
Glucose, UA: 100 mg/dL — AB
Ketones, ur: NEGATIVE mg/dL
Nitrite: NEGATIVE
Protein, ur: NEGATIVE mg/dL
pH: 5.5 (ref 5.0–8.0)

## 2013-07-03 LAB — CBC WITH DIFFERENTIAL/PLATELET
Basophils Absolute: 0 10*3/uL (ref 0.0–0.1)
Eosinophils Absolute: 0.2 10*3/uL (ref 0.0–0.7)
Eosinophils Relative: 3 % (ref 0–5)
HCT: 38.5 % — ABNORMAL LOW (ref 39.0–52.0)
Lymphocytes Relative: 26 % (ref 12–46)
MCH: 31.2 pg (ref 26.0–34.0)
MCV: 89.7 fL (ref 78.0–100.0)
Monocytes Absolute: 0.5 10*3/uL (ref 0.1–1.0)
Platelets: 238 10*3/uL (ref 150–400)
RDW: 13.4 % (ref 11.5–15.5)
WBC: 6 10*3/uL (ref 4.0–10.5)

## 2013-07-03 MED ORDER — SODIUM CHLORIDE 0.9 % IV SOLN: INTRAVENOUS | Status: DC

## 2013-07-03 NOTE — ED Notes (Signed)
While standing during orthostatic vitals patient complained of dizziness. While sitting the patient was just a little dizzy and while laying down patient was not dizzy.

## 2013-07-03 NOTE — ED Notes (Signed)
Pt and wife refuse discharge at this time since records from pt's ER visit in [place] have not yet been sent.  EDP aware, facility contacted again to send previous records.  Pt resting at this time, no distress noted.

## 2013-07-03 NOTE — ED Notes (Signed)
Pt states he was told to come to the ED for evaluation of kidney function. States he works out of time and was evaluated in [place] and told to get evaluated immediately. Wife states pt has a brother and his mother only has one kidney

## 2013-07-03 NOTE — ED Notes (Signed)
Pt reporting pain in upper back.  Also nausea and dizziness.  Pt states that symptoms began several days ago and he was seen in [place] and was diagnosed with kidney failure.  Pt was instructed to see PCP here, but at this time PCP is on vacation. Pt to department for reevaluation.  Placed on cardiac monitor, EKG completed.

## 2013-07-03 NOTE — ED Provider Notes (Signed)
History    CSN: [id number] Arrival date & time 07/03/13  1825  First MD Initiated Contact with Patient 07/03/13 1836     Chief Complaint  Patient presents with  . Abnormal Lab    HPI Pt was seen at 1845.  Per pt, c/o gradual onset and persistence of constant "abnormal labs" that began this week. Pt states he works outside and began to feel "dizzy" and "generally weak" 2 days ago when he was out of town. Pt states he was evaluated at an ED in [place] for same and was dx with "kidney failure." Pt states he was discharged and told to f/u with his PMD. Pt states he could not get in to see his PMD so he came to the ED for eval. Pt states he continues to have the same symptoms, without change, over the past 2 days. Denies CP/palpitations, no SOB/cough, no abd pain, no N/V/D, no fevers, no focal motor weakness, no tingling/numbness in extremities, no facial droop, no slurred speech, no visual changes, no headache, no syncope.     Past Medical History  Diagnosis Date  . Hypertension   . Hyperlipidemia   . Chest pain     Multiple episodes of chest pain; cath 11/2008: No CAD, normal EF  . Ventricular tachycardia     radiofrequency ablation in 12/2008; H/o myocardial infarction, but no coronary artery disease on coronary angiography or wall motion abnormality on echo  . Syncope     recurrent resulting in hospitalization in 01/2013; hypotensive with acute renal failure, likely medication induced  . Tobacco abuse     45 pack years; 2013-consumption reduced to 0.5 pack per day  . Degenerative joint disease     Lumbosacral spine; chronic low back pain  . MRSA (methicillin resistant Staphylococcus aureus) infection   . Depression   . TIA (transient ischemic attack)   . Chronic back pain    Past Surgical History  Procedure Laterality Date  . Radiofrequency ablation      Ventricular tachycardia   Family History  Problem Relation Age of Onset  . Heart attack Mother     Coronary artery  disease, prior PCI  . Heart failure Mother   . Heart attack Brother     Prior PCI & CHF;  also cousin prior to the age of 35  . Cancer Father     Also mother   History  Substance Use Topics  . Smoking status: Current Every Day Smoker -- 0.50 packs/day    Types: Cigarettes  . Smokeless tobacco: Not on file     Comment: half a pack for 3 months   . Alcohol Use: No    Review of Systems ROS: Statement: All systems negative except as marked or noted in the HPI; Constitutional: Negative for fever and chills. +generalized weakness/fatigue.; ; Eyes: Negative for eye pain, redness and discharge. ; ; ENMT: Negative for ear pain, hoarseness, nasal congestion, sinus pressure and sore throat. ; ; Cardiovascular: Negative for chest pain, palpitations, diaphoresis, dyspnea and peripheral edema. ; ; Respiratory: Negative for cough, wheezing and stridor. ; ; Gastrointestinal: Negative for nausea, vomiting, diarrhea, abdominal pain, blood in stool, hematemesis, jaundice and rectal bleeding. . ; ; Genitourinary: Negative for dysuria, flank pain and hematuria. ; ; Musculoskeletal: Negative for back pain and neck pain. Negative for swelling and trauma.; ; Skin: Negative for pruritus, rash, abrasions, blisters, bruising and skin lesion.; ; Neuro: +"dizziness."  Negative for headache, lightheadedness and neck stiffness. Negative for  altered level of consciousness , altered mental status, extremity weakness, paresthesias, involuntary movement, seizure and syncope.      Allergies  Darvocet and Feldene  Home Medications   Current Outpatient Rx  Name  Route  Sig  Dispense  Refill  . ALPRAZolam (XANAX) 0.5 MG tablet   Oral   Take 0.5 mg by mouth at bedtime as needed for sleep or anxiety.          Marland Kitchen amitriptyline (ELAVIL) 10 MG tablet   Oral   Take 10 mg by mouth at bedtime.         Marland Kitchen aspirin 325 MG tablet   Oral   Take 325 mg by mouth daily.          Marland Kitchen atorvastatin (LIPITOR) 40 MG tablet   Oral    Take 40 mg by mouth daily.         . diphenhydrAMINE (BENADRYL) 50 MG tablet   Oral   Take 50 mg by mouth at bedtime.         . furosemide (LASIX) 40 MG tablet   Oral   Take 40 mg by mouth daily as needed for fluid or edema.         Marland Kitchen labetalol (NORMODYNE) 200 MG tablet   Oral   Take 1 tablet (200 mg total) by mouth 2 (two) times daily.   60 tablet   6   . losartan (COZAAR) 100 MG tablet   Oral   Take 100 mg by mouth daily.          Marland Kitchen omeprazole (PRILOSEC) 40 MG capsule   Oral   Take 40 mg by mouth daily.         Marland Kitchen oxycodone (OXY-IR) 5 MG capsule   Oral   Take 5 mg by mouth 4 (four) times daily.          Marland Kitchen oxyCODONE (ROXICODONE) 15 MG immediate release tablet   Oral   Take 15 mg by mouth 4 (four) times daily.         . sotalol (BETAPACE) 80 MG tablet   Oral   Take 80 mg by mouth 2 (two) times daily. As needed         . albuterol (PROVENTIL HFA;VENTOLIN HFA) 108 (90 BASE) MCG/ACT inhaler   Inhalation   Inhale 2 puffs into the lungs every 6 (six) hours as needed. For shortness of breath/wheezing         . nitroGLYCERIN (NITROSTAT) 0.4 MG SL tablet   Sublingual   Place 0.4 mg under the tongue every 5 (five) minutes as needed.         . promethazine (PHENERGAN) 25 MG tablet   Oral   Take 25 mg by mouth every 8 (eight) hours as needed for nausea.           BP 133/66  Pulse 66  Temp(Src) 98.5 F (36.9 C) (Oral)  Resp 20  Ht 5\' 11"  (1.803 m)  Wt 212 lb (96.163 kg)  BMI 29.58 kg/m2  SpO2 99% Physical Exam 1850: Physical examination:  Nursing notes reviewed; Vital signs and O2 SAT reviewed;  Constitutional: Well developed, Well nourished, Well hydrated, In no acute distress; Head:  Normocephalic, atraumatic; Eyes: EOMI, PERRL, No scleral icterus; ENMT: Mouth and pharynx normal, Mucous membranes moist; Neck: Supple, Full range of motion, No lymphadenopathy; Cardiovascular: Regular rate and rhythm, No murmur, rub, or gallop; Respiratory: Breath  sounds clear & equal bilaterally, No rales, rhonchi, wheezes.  Speaking  full sentences with ease, Normal respiratory effort/excursion; Chest: Nontender, Movement normal; Abdomen: Soft, Nontender, Nondistended, Normal bowel sounds; Genitourinary: No CVA tenderness; Spine:  No midline CS, TS, LS tenderness.;; Extremities: Pulses normal, No tenderness, No edema, No calf edema or asymmetry.; Neuro: AA&Ox3, Major CN grossly intact. No facial droop. Speech clear. No gross focal motor or sensory deficits in extremities.; Skin: Color normal, Warm, Dry.   ED Course  Procedures     MDM  MDM Reviewed: previous chart, nursing note and vitals Reviewed previous: labs and ECG Interpretation: labs, ECG and x-ray    Date: 07/03/2013  Rate: 61  Rhythm: normal sinus rhythm  QRS Axis: normal  Intervals: normal  ST/T Wave abnormalities: normal  Conduction Disutrbances:none  Narrative Interpretation:   Old EKG Reviewed: unchanged; no significant changes from previous EKG dated 01/23/2013.   Results for orders placed during the hospital encounter of 07/03/13  BASIC METABOLIC PANEL      Result Value Range   Sodium 139  135 - 145 mEq/L   Potassium 4.0  3.5 - 5.1 mEq/L   Chloride 104  96 - 112 mEq/L   CO2 25  19 - 32 mEq/L   Glucose, Bld 126 (*) 70 - 99 mg/dL   BUN 23  6 - 23 mg/dL   Creatinine, Ser 1.61  0.50 - 1.35 mg/dL   Calcium 9.7  8.4 - 09.6 mg/dL   GFR calc non Af Amer 64 (*) >90 mL/min   GFR calc Af Amer 74 (*) >90 mL/min  CBC WITH DIFFERENTIAL      Result Value Range   WBC 6.0  4.0 - 10.5 K/uL   RBC 4.29  4.22 - 5.81 MIL/uL   Hemoglobin 13.4  13.0 - 17.0 g/dL   HCT 04.5 (*) 40.9 - 81.1 %   MCV 89.7  78.0 - 100.0 fL   MCH 31.2  26.0 - 34.0 pg   MCHC 34.8  30.0 - 36.0 g/dL   RDW 91.4  78.2 - 95.6 %   Platelets 238  150 - 400 K/uL   Neutrophils Relative % 62  43 - 77 %   Neutro Abs 3.7  1.7 - 7.7 K/uL   Lymphocytes Relative 26  12 - 46 %   Lymphs Abs 1.6  0.7 - 4.0 K/uL   Monocytes  Relative 8  3 - 12 %   Monocytes Absolute 0.5  0.1 - 1.0 K/uL   Eosinophils Relative 3  0 - 5 %   Eosinophils Absolute 0.2  0.0 - 0.7 K/uL   Basophils Relative 1  0 - 1 %   Basophils Absolute 0.0  0.0 - 0.1 K/uL  URINALYSIS W MICROSCOPIC + REFLEX CULTURE      Result Value Range   Color, Urine YELLOW  YELLOW   APPearance CLEAR  CLEAR   Specific Gravity, Urine >1.030 (*) 1.005 - 1.030   pH 5.5  5.0 - 8.0   Glucose, UA 100 (*) NEGATIVE mg/dL   Hgb urine dipstick TRACE (*) NEGATIVE   Bilirubin Urine NEGATIVE  NEGATIVE   Ketones, ur NEGATIVE  NEGATIVE mg/dL   Protein, ur NEGATIVE  NEGATIVE mg/dL   Urobilinogen, UA 0.2  0.0 - 1.0 mg/dL   Nitrite NEGATIVE  NEGATIVE   Leukocytes, UA NEGATIVE  NEGATIVE   WBC, UA 0-2  <3 WBC/hpf   RBC / HPF 0-2  <3 RBC/hpf  TROPONIN I      Result Value Range   Troponin I <0.30  <0.30 ng/mL  Dg Chest 2 View 07/03/2013   *RADIOLOGY REPORT*  Clinical Data: Dizziness and weakness.  CHEST - 2 VIEW  Comparison: 01/22/2013  Findings: The heart, mediastinum and hila are within normal limits. The lungs are clear.  There is a stable BB that projects along the anterior margin of the sternum.  The bony thorax is intact.  IMPRESSION: No active disease of the chest.   Original Report Authenticated By: Amie Portland, M.D.    Results for LAUREANO, HETZER (MRN [id number]) as of 07/03/2013 21:08  Ref. Range 01/23/2013 03:17 01/24/2013 05:51 02/17/2013 14:35 07/03/2013 19:20  BUN Latest Range: 6-23 mg/dL 31 (H) 22 17 23   Creatinine Latest Range: 0.50-1.35 mg/dL 7.25 (H) 3.66 (H) 4.40 1.30     2245: Records from [facility] in [place], [place] received: pt eval in ED on 07/01/13 with c/o feeling like he had "heat exhaustion" after working outside the day previous; was c/o whole body cramping, seeing black dots like I'm going to faint, feeling dizzy, generalized weakness, and having chest pain that began the day before; BUN/Cr noted 36/3.35, otherwise workup unremarkable with normal EKG,  troponin and electrolytes; pt was given IVF and d/c to f/u with PMD. This reviewed with pt and his wife. Pt's wife stated to me that she "didn't realize" that his symptoms began "after he was outside all day." EPIC chart reviewed: pt was admitted in 01/2013 with elevated renal function tests thought to be due to dehydration at that time (BUN/Cr improved after IVF). This also reviewed with pt and wife. Verb understanding.  States they are ready to leave now and f/u with his PMD this week.  2250:  Pt stated he was "a little dizzy" when he sat up on the stretcher from laying, but was otherwise without any symptoms. Doubt PE as cause for symptoms with low risk [place].  Doubt ACS as cause for symptoms with normal troponin and unchanged EKG from previous after 3 days of constant atypical symptoms (dizziness, generalized weakness).  Pt has tol PO well while in the ED without N/V. Pt states he feels "fine" and wants to go home now. Dx and testing d/w pt and family.  Questions answered.  Verb understanding, agreeable to d/c home with outpt f/u.              Laray Anger, DO 07/06/13 1640

## 2013-10-21 ENCOUNTER — Telehealth: Payer: Self-pay | Admitting: Cardiology

## 2013-10-21 MED ORDER — AMLODIPINE BESYLATE 10 MG PO TABS: 10.0000 mg | ORAL_TABLET | Freq: Every day | ORAL | Status: DC

## 2013-10-21 MED ORDER — LABETALOL HCL 200 MG PO TABS: 200.0000 mg | ORAL_TABLET | Freq: Two times a day (BID) | ORAL | Status: DC

## 2013-10-21 NOTE — Telephone Encounter (Signed)
Dr Dietrich Pates had mentioned stopping the norvasc due to lower extremity edema. If he continued to take it and this was no longer a problem then we will refill the medication. Please confirm if his lower extremity resolved or note while he was still taking the norvasc

## 2013-10-21 NOTE — Telephone Encounter (Signed)
Pt advised he has not had a problem since that one episode. This nurse refilled prescription for Norvasc Spoke to patient concerning lab/test results/instructions from provider. Patient understood.

## 2013-10-21 NOTE — Telephone Encounter (Signed)
Needs refills on Amlodipine and Labetalol sent to [facility] in [place] / tgs

## 2013-10-21 NOTE — Telephone Encounter (Signed)
Pt called stating that he needed a refill on Amlodipine 10 mg. On office note Dr Dietrich Pates discontinued it. Pt states he still had refills and kept taking it. This nurse called pharmacy and he just ran out of Norvasc. Pt states he wants to take something or he can go back to Clonidine patches. Please advise.

## 2014-01-29 ENCOUNTER — Telehealth: Payer: Self-pay | Admitting: Cardiology

## 2014-03-04 ENCOUNTER — Encounter (HOSPITAL_COMMUNITY): Payer: Self-pay | Admitting: Emergency Medicine

## 2014-03-04 ENCOUNTER — Emergency Department (HOSPITAL_COMMUNITY)
Admission: EM | Admit: 2014-03-04 | Discharge: 2014-03-04 | Discharge status: Against Medical Advice | Payer: Managed Care, Other (non HMO) | Attending: Emergency Medicine | Admitting: Emergency Medicine

## 2014-03-04 DIAGNOSIS — Z8673 Personal history of transient ischemic attack (TIA), and cerebral infarction without residual deficits: Secondary | ICD-10-CM | POA: Insufficient documentation

## 2014-03-04 DIAGNOSIS — I10 Essential (primary) hypertension: Secondary | ICD-10-CM | POA: Insufficient documentation

## 2014-03-04 DIAGNOSIS — K625 Hemorrhage of anus and rectum: Secondary | ICD-10-CM | POA: Insufficient documentation

## 2014-03-04 DIAGNOSIS — F17200 Nicotine dependence, unspecified, uncomplicated: Secondary | ICD-10-CM | POA: Insufficient documentation

## 2014-03-04 NOTE — ED Notes (Signed)
Pt had onset to rectal bleeding yesterday x2 and again today x1.  Denies pain.  No N/V

## 2014-03-04 NOTE — ED Notes (Signed)
No answer x 1 for room placement

## 2014-03-04 NOTE — Telephone Encounter (Signed)
Error

## 2014-07-08 ENCOUNTER — Other Ambulatory Visit (HOSPITAL_COMMUNITY): Payer: Self-pay | Admitting: *Deleted

## 2014-07-08 ENCOUNTER — Ambulatory Visit (HOSPITAL_COMMUNITY)
Admission: RE | Admit: 2014-07-08 | Discharge: 2014-07-08 | Disposition: A | Discharge status: Home / Self Care | Payer: Managed Care, Other (non HMO) | Source: Ambulatory Visit | Attending: Family Medicine | Admitting: Family Medicine

## 2014-07-08 DIAGNOSIS — M5126 Other intervertebral disc displacement, lumbar region: Secondary | ICD-10-CM | POA: Insufficient documentation

## 2014-07-08 DIAGNOSIS — M5442 Lumbago with sciatica, left side: Principal | ICD-10-CM

## 2014-07-08 DIAGNOSIS — M5441 Lumbago with sciatica, right side: Secondary | ICD-10-CM

## 2014-07-08 DIAGNOSIS — M5430 Sciatica, unspecified side: Secondary | ICD-10-CM | POA: Insufficient documentation

## 2014-07-11 ENCOUNTER — Other Ambulatory Visit: Payer: Self-pay | Admitting: Adult Health

## 2014-07-12 ENCOUNTER — Telehealth: Payer: Self-pay | Admitting: Cardiology

## 2014-07-12 NOTE — Telephone Encounter (Signed)
Received fax refill request  Rx # [id number] Medication:  Amlodipine Besylate 10 mg tab Qty 30 Sig:  Take one tablet by mouth once daily Physician:  Wyline MoodBranch

## 2014-07-14 ENCOUNTER — Telehealth: Payer: Self-pay | Admitting: Cardiology

## 2014-07-14 MED ORDER — AMLODIPINE BESYLATE 10 MG PO TABS: 10.0000 mg | ORAL_TABLET | Freq: Every day | ORAL | Status: DC

## 2014-07-14 NOTE — Telephone Encounter (Signed)
Received fax refill request  Rx # [id number] Medication:  Amlodipine Besylate 10 mg tab Qty 30 Sig:  Take one tablet by mouth once daily Physician:  Wyline MoodBranch

## 2014-07-14 NOTE — Telephone Encounter (Signed)
Medication sent via escribe.  

## 2014-07-17 ENCOUNTER — Emergency Department (HOSPITAL_COMMUNITY): Payer: Managed Care, Other (non HMO)

## 2014-07-17 ENCOUNTER — Encounter (HOSPITAL_COMMUNITY): Payer: Self-pay | Admitting: Emergency Medicine

## 2014-07-17 ENCOUNTER — Emergency Department (HOSPITAL_COMMUNITY)
Admission: EM | Admit: 2014-07-17 | Discharge: 2014-07-17 | Disposition: A | Discharge status: Home / Self Care | Payer: Managed Care, Other (non HMO) | Attending: Emergency Medicine | Admitting: Emergency Medicine

## 2014-07-17 DIAGNOSIS — S93402A Sprain of unspecified ligament of left ankle, initial encounter: Secondary | ICD-10-CM

## 2014-07-17 DIAGNOSIS — Z862 Personal history of diseases of the blood and blood-forming organs and certain disorders involving the immune mechanism: Secondary | ICD-10-CM | POA: Insufficient documentation

## 2014-07-17 DIAGNOSIS — S99919A Unspecified injury of unspecified ankle, initial encounter: Secondary | ICD-10-CM

## 2014-07-17 DIAGNOSIS — F3289 Other specified depressive episodes: Secondary | ICD-10-CM | POA: Insufficient documentation

## 2014-07-17 DIAGNOSIS — Z8639 Personal history of other endocrine, nutritional and metabolic disease: Secondary | ICD-10-CM | POA: Insufficient documentation

## 2014-07-17 DIAGNOSIS — S99929A Unspecified injury of unspecified foot, initial encounter: Secondary | ICD-10-CM

## 2014-07-17 DIAGNOSIS — G8929 Other chronic pain: Secondary | ICD-10-CM | POA: Insufficient documentation

## 2014-07-17 DIAGNOSIS — Y9389 Activity, other specified: Secondary | ICD-10-CM | POA: Insufficient documentation

## 2014-07-17 DIAGNOSIS — Z79899 Other long term (current) drug therapy: Secondary | ICD-10-CM | POA: Insufficient documentation

## 2014-07-17 DIAGNOSIS — S93409A Sprain of unspecified ligament of unspecified ankle, initial encounter: Secondary | ICD-10-CM | POA: Insufficient documentation

## 2014-07-17 DIAGNOSIS — I10 Essential (primary) hypertension: Secondary | ICD-10-CM | POA: Insufficient documentation

## 2014-07-17 DIAGNOSIS — Z7982 Long term (current) use of aspirin: Secondary | ICD-10-CM | POA: Insufficient documentation

## 2014-07-17 DIAGNOSIS — F329 Major depressive disorder, single episode, unspecified: Secondary | ICD-10-CM | POA: Insufficient documentation

## 2014-07-17 DIAGNOSIS — F17200 Nicotine dependence, unspecified, uncomplicated: Secondary | ICD-10-CM | POA: Insufficient documentation

## 2014-07-17 DIAGNOSIS — M25562 Pain in left knee: Secondary | ICD-10-CM

## 2014-07-17 DIAGNOSIS — W2203XA Walked into furniture, initial encounter: Secondary | ICD-10-CM | POA: Insufficient documentation

## 2014-07-17 DIAGNOSIS — S8990XA Unspecified injury of unspecified lower leg, initial encounter: Secondary | ICD-10-CM | POA: Insufficient documentation

## 2014-07-17 DIAGNOSIS — Y929 Unspecified place or not applicable: Secondary | ICD-10-CM | POA: Insufficient documentation

## 2014-07-17 DIAGNOSIS — Z8673 Personal history of transient ischemic attack (TIA), and cerebral infarction without residual deficits: Secondary | ICD-10-CM | POA: Insufficient documentation

## 2014-07-17 DIAGNOSIS — Z8614 Personal history of Methicillin resistant Staphylococcus aureus infection: Secondary | ICD-10-CM | POA: Insufficient documentation

## 2014-07-17 MED ORDER — OXYCODONE-ACETAMINOPHEN 5-325 MG PO TABS
2.0000 | ORAL_TABLET | Freq: Once | ORAL | Status: AC
  Administered 2014-07-17: 2 via ORAL
  Filled 2014-07-17: qty 2

## 2014-07-17 MED ORDER — HYDROCODONE-ACETAMINOPHEN 7.5-325 MG PO TABS: 1.0000 | ORAL_TABLET | ORAL | Status: DC | PRN

## 2014-07-17 MED ORDER — DICLOFENAC SODIUM 75 MG PO TBEC: 75.0000 mg | DELAYED_RELEASE_TABLET | Freq: Two times a day (BID) | ORAL | Status: DC

## 2014-07-17 NOTE — ED Provider Notes (Signed)
CSN: [id number]     Arrival date & time 07/17/14  1310 History   First MD Initiated Contact with Patient 07/17/14 1324     Chief Complaint  Patient presents with  . Foot Injury   Roanna RaiderFreddie L Floyd is a 48 y.o. male who presents to the Emergency Department complaining of pain, bruising and swelling ot the left ankle and foot and pain to the left knee.  Patient reports tripping over a coffee table, twisting the left knee and the table flipped over and landed on the top of his left foot.  He c/o "giving away" in his left leg that is chronic and he is currently receiving "injections" in his back from pain management.  He denies redness, swelling of the knee, fever, chills or numbness to the left leg.    (Consider location/radiation/quality/duration/timing/severity/associated sxs/prior Treatment) Patient is a 48 y.o. male presenting with foot injury. The history is provided by the patient.  Foot Injury Location:  Ankle, foot and knee Time since incident:  4 days Injury: yes   Mechanism of injury: fall   Fall:    Fall occurred:  Tripped   Point of impact:  Knees   Entrapped after fall: no   Knee location:  L knee Ankle location:  L ankle Foot location:  L foot Pain details:    Quality:  Aching and throbbing   Radiates to:  Does not radiate   Severity:  Moderate   Onset quality:  Gradual   Timing:  Constant   Progression:  Worsening Chronicity:  New Dislocation: no   Relieved by:  Nothing Worsened by:  Activity and bearing weight Ineffective treatments: pain medication. Associated symptoms: no fever and no neck pain     Past Medical History  Diagnosis Date  . Hypertension   . Hyperlipidemia   . Chest pain     Multiple episodes of chest pain; cath 11/2008: No CAD, normal EF  . Ventricular tachycardia     radiofrequency ablation in 12/2008; H/o myocardial infarction, but no coronary artery disease on coronary angiography or wall motion abnormality on echo  . Syncope     recurrent  resulting in hospitalization in 01/2013; hypotensive with acute renal failure, likely medication induced  . Tobacco abuse     45 pack years; 2013-consumption reduced to 0.5 pack per day  . Degenerative joint disease     Lumbosacral spine; chronic low back pain  . MRSA (methicillin resistant Staphylococcus aureus) infection   . Depression   . TIA (transient ischemic attack)   . Chronic back pain    Past Surgical History  Procedure Laterality Date  . Radiofrequency ablation      Ventricular tachycardia   Family History  Problem Relation Age of Onset  . Heart attack Mother     Coronary artery disease, prior PCI  . Heart failure Mother   . Heart attack Brother     Prior PCI & CHF;  also cousin prior to the age of 50  . Cancer Father     Also mother   History  Substance Use Topics  . Smoking status: Current Every Day Smoker -- 0.50 packs/day    Types: Cigarettes  . Smokeless tobacco: Not on file     Comment: half a pack for 3 months   . Alcohol Use: No     Comment: denies     Review of Systems  Constitutional: Negative for fever and chills.  Genitourinary: Negative for dysuria and difficulty urinating.  Musculoskeletal:  Positive for arthralgias and joint swelling. Negative for neck pain and neck stiffness.       Pain, swelling of left foot, ankle and knee  Skin: Negative for color change and wound.  Neurological: Negative for dizziness, weakness and numbness.  All other systems reviewed and are negative.     Allergies  Darvocet and Feldene  Home Medications   Prior to Admission medications   Medication Sig Start Date End Date Taking? Authorizing Provider  ALPRAZolam Prudy Feeler) 0.5 MG tablet Take 0.5 mg by mouth at bedtime.  12/08/12  Yes Historical Provider, MD  amitriptyline (ELAVIL) 25 MG tablet Take 25 mg by mouth at bedtime.   Yes Historical Provider, MD  aspirin 325 MG tablet Take 325 mg by mouth daily.    Yes Historical Provider, MD  diphenhydrAMINE (BENADRYL) 50  MG tablet Take 50 mg by mouth at bedtime.   Yes Historical Provider, MD  furosemide (LASIX) 40 MG tablet Take 40 mg by mouth 2 (two) times daily.    Yes Historical Provider, MD  losartan (COZAAR) 100 MG tablet Take 100 mg by mouth daily.  02/03/13  Yes Historical Provider, MD  omeprazole (PRILOSEC) 40 MG capsule Take 40 mg by mouth daily.   Yes Historical Provider, MD  OxyCODONE (OXYCONTIN) 20 mg T12A 12 hr tablet Take 20 mg by mouth 4 (four) times daily.   Yes Historical Provider, MD  promethazine (PHENERGAN) 25 MG tablet Take 25 mg by mouth every 8 (eight) hours as needed for nausea.    Yes Historical Provider, MD  sotalol (BETAPACE) 80 MG tablet Take 80 mg by mouth 2 (two) times daily.    Yes Historical Provider, MD  nitroGLYCERIN (NITROSTAT) 0.4 MG SL tablet Place 0.4 mg under the tongue every 5 (five) minutes as needed.    Historical Provider, MD   BP 184/92  Pulse 64  Temp(Src) 99.5 F (37.5 C) (Oral)  Resp 16  Ht 5\' 11"  (1.803 m)  Wt 212 lb (96.163 kg)  BMI 29.58 kg/m2  SpO2 97% Physical Exam  Nursing note and vitals reviewed. Constitutional: He is oriented to person, place, and time. He appears well-developed and well-nourished. No distress.  HENT:  Head: Normocephalic and atraumatic.  Cardiovascular: Normal rate, regular rhythm, normal heart sounds and intact distal pulses.   No murmur heard. Pulmonary/Chest: Effort normal and breath sounds normal. No respiratory distress.  Musculoskeletal: He exhibits edema and tenderness.  Diffuse ttp of the left knee.  No erythema, or step-off deformity.  Pt has full ROM with pain reproduced on flexion.  Also localized ttp of the lateral left ankle and dorsal foot.  Moderate STS of the dorsal foot and ecchymosis of the lower foot.  DP pulse brisk, distal sensation intact. Calf is soft and NT.  Neurological: He is alert and oriented to person, place, and time. He exhibits normal muscle tone. Coordination normal.  Skin: Skin is warm and dry. No  erythema.    ED Course  Procedures (including critical care time) Labs Review Labs Reviewed - No data to display  Imaging Review Dg Ankle Complete Left  07/17/2014   CLINICAL DATA:  Left-sided foot pain  EXAM: LEFT ANKLE COMPLETE - 3+ VIEW  COMPARISON:  None.  FINDINGS: There is no evidence of fracture, dislocation, or joint effusion. There is no evidence of arthropathy or other focal bone abnormality. Soft tissues are unremarkable.  IMPRESSION: No acute abnormality noted.   Electronically Signed   By: Alcide Clever M.D.   On:  07/17/2014 14:34   Dg Knee Complete 4 Views Left  07/17/2014   CLINICAL DATA:  Left knee pain following a fall 4 days ago.  EXAM: LEFT KNEE - COMPLETE 4+ VIEW  COMPARISON:  None.  FINDINGS: Moderately large effusion. Mild to moderate medial joint space narrowing and spur formation. Minimal posterior patellar spur formation. No fracture or dislocation seen.  IMPRESSION: 1. Moderately large effusion. 2. No fracture seen. 3. Degenerative changes.   Electronically Signed   By: Gordan Payment M.D.   On: 07/17/2014 14:36   Dg Foot Complete Left  07/17/2014   CLINICAL DATA:  Left distal foot and proximal ankle pain and swelling for the past 4 days following a fall.  EXAM: LEFT FOOT - COMPLETE 3+ VIEW  COMPARISON:  None.  FINDINGS: Moderate-sized inferior calcaneal spur.  No fracture or dislocation.  IMPRESSION: No fracture.   Electronically Signed   By: Gordan Payment M.D.   On: 07/17/2014 14:35     EKG Interpretation None      MDM   Final diagnoses:  Knee pain, acute, left  Ankle sprain, left, initial encounter    Pt with edema , pain to left knee and left ankle/foot after a fall. Foot and ankle appear to be healing.   No bony deformity of the knee, but exam is concerning for ligamentous injury.  Pt agrees to knee immobilizer, ice , elevate and close orthopedic f/u.  Rx for voltaren and norco.  Ortho referral given.      Vega Withrow L. Trisha Mangle, PA-C 07/18/14 2158

## 2014-07-17 NOTE — ED Notes (Signed)
Pt verbalized understanding of no driving within 4 hours of taking pain med due to pain can cause drowsiness

## 2014-07-17 NOTE — ED Notes (Signed)
Pt states that he has problems with his back and left leg giving away on him, happened again this past Wednesday evening causing hit the top of the coffee table to land on his left foot,cms intact distal, pt has swelling noted to left foot area.

## 2014-07-17 NOTE — Discharge Instructions (Signed)

## 2014-07-19 NOTE — ED Provider Notes (Signed)
Medical screening examination/treatment/procedure(s) were performed by non-physician practitioner and as supervising physician I was immediately available for consultation/collaboration.   EKG Interpretation None        Laray AngerKathleen M Jacqualyn Sedgwick, DO 07/19/14 2156

## 2014-07-20 ENCOUNTER — Telehealth: Payer: Self-pay | Admitting: Orthopedic Surgery

## 2014-07-20 NOTE — Telephone Encounter (Signed)
Patient called following [facility] Shea Emergency Room visit 07/17/14 for problem of left knee pain.  He states he has also mentioned this knee pain to his pain management provider, and has had injections done on the knee; therefore, I relayed that Dr Romeo AppleHarrison will need the treatment notes prior to scheduling an appointment.  He will request the notes, and will be in contact with our office to follow up about scheduling.  His ph# is [phone].

## 2014-07-27 ENCOUNTER — Telehealth: Payer: Self-pay | Admitting: Orthopedic Surgery

## 2014-07-27 NOTE — Telephone Encounter (Signed)
Call back from patient, provided previous medical provider, Dr. Elwyn ReachSkeen, pain management specialist, who had also seen patient for left knee.  As noted per previous call, following patient's visit to Emergency Room, notes will be needed for Dr Romeo AppleHarrison to review.  Request faxed to Dr. Elwyn ReachSkeen, ph# [phone], fax [phone]

## 2014-08-24 NOTE — Telephone Encounter (Signed)
No records received or further request; patient mentioned "having a balance at this doctor's office; therefore, they may not release the records.

## 2014-08-27 ENCOUNTER — Institutional Professional Consult (permissible substitution): Payer: Self-pay | Admitting: Neurology

## 2014-08-27 ENCOUNTER — Telehealth: Payer: Self-pay | Admitting: Neurology

## 2014-08-27 NOTE — Telephone Encounter (Signed)
Patient no showed for an appointment today, 08/27/2014, at 0900.

## 2014-09-02 ENCOUNTER — Encounter: Payer: Self-pay | Admitting: Neurology

## 2014-10-14 IMAGING — US US RENAL
1 series · 14 of 25 positions shown · non-contrast
Comparison: None.

CLINICAL DATA: 46-year-old male with acute renal failure.
Hypertension.

RENAL/URINARY TRACT ULTRASOUND COMPLETE

[Series 1: us renal · 0.26mm/px · 14 of 71 slices shown]
[im 1/71]
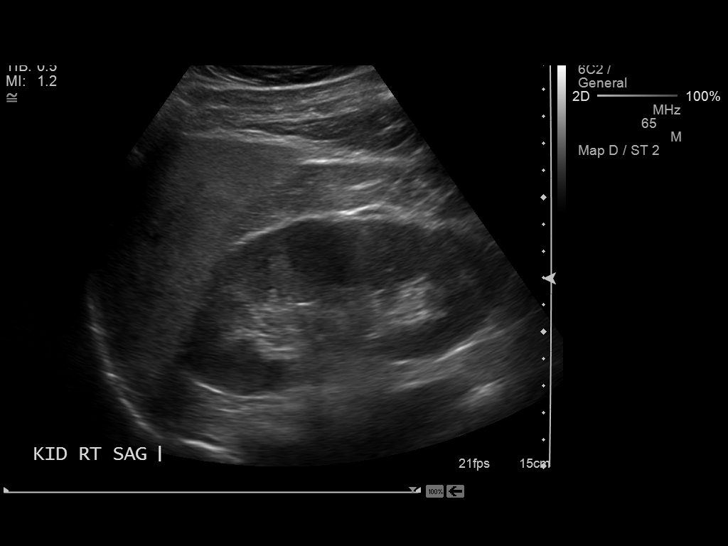
[im 6/71]
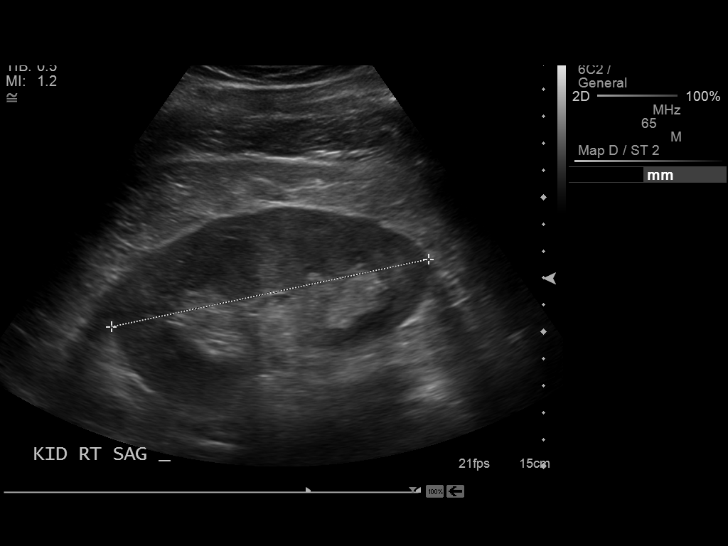
[im 12/71]
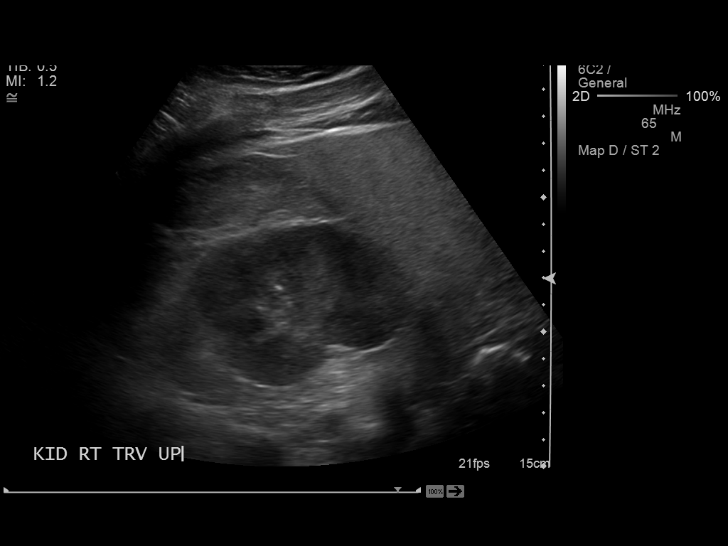
[im 18/71]
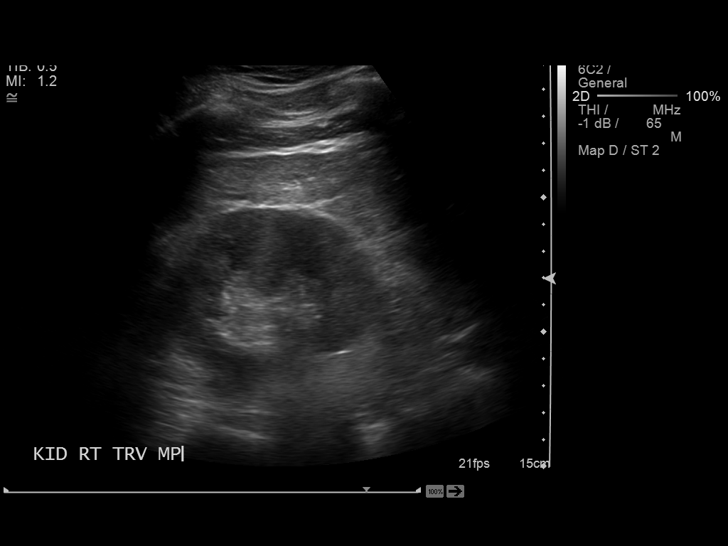
[im 24/71]
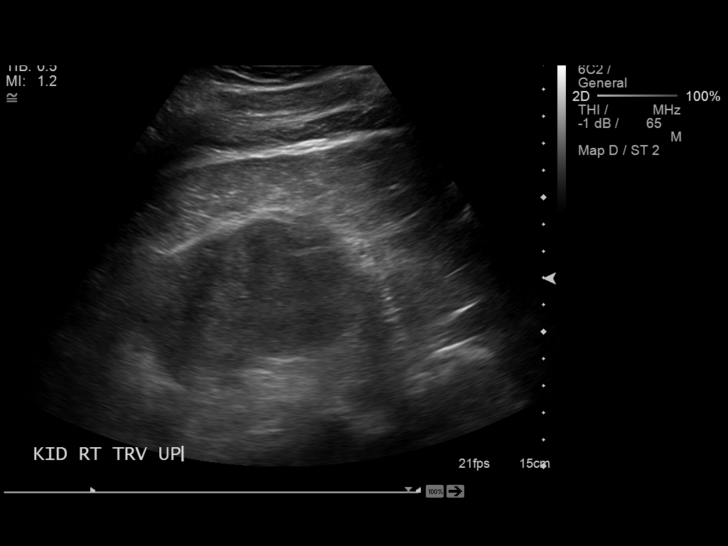
[im 27/71]
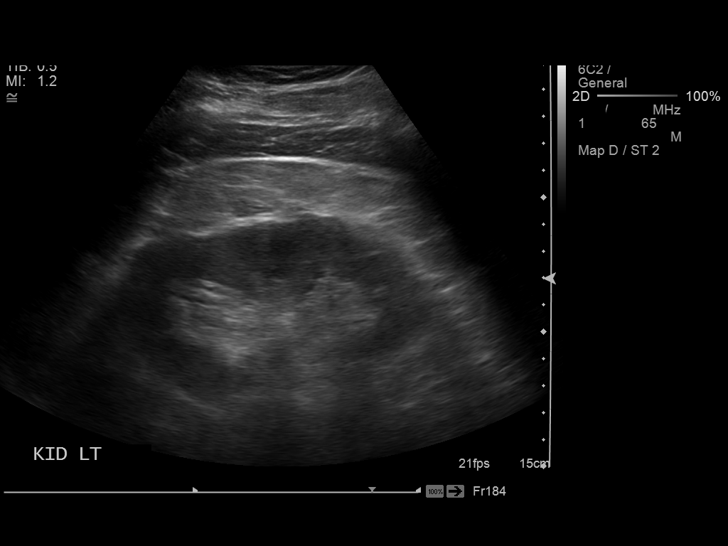
[im 33/71]
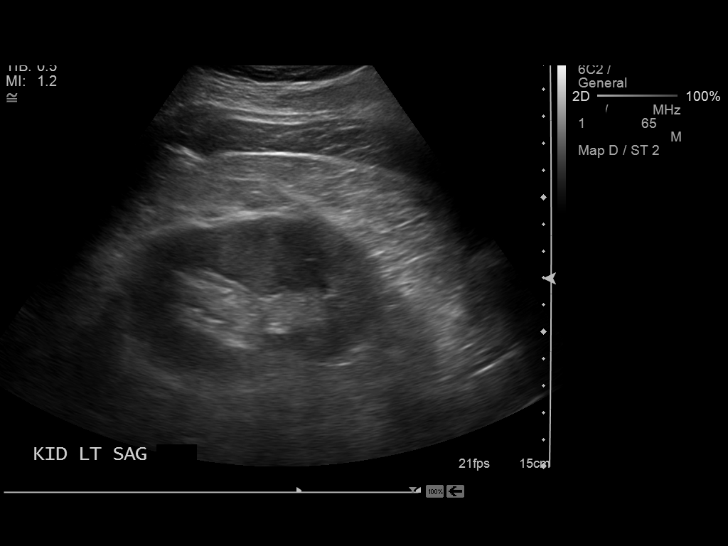
[im 38/71]
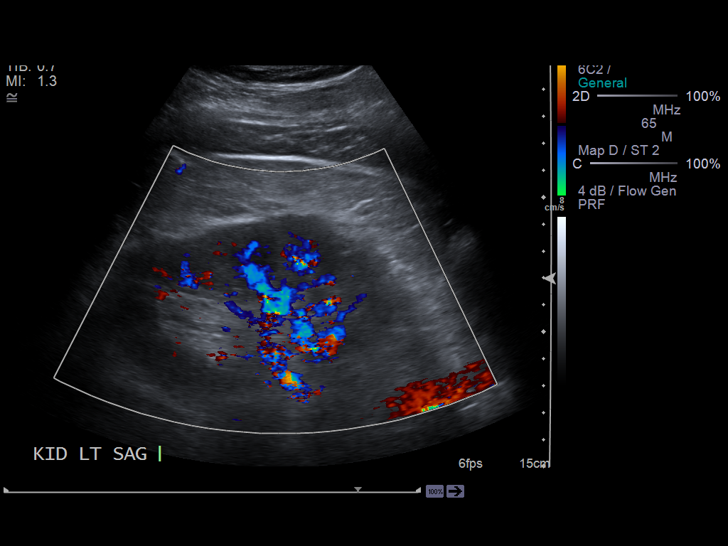
[im 44/71]
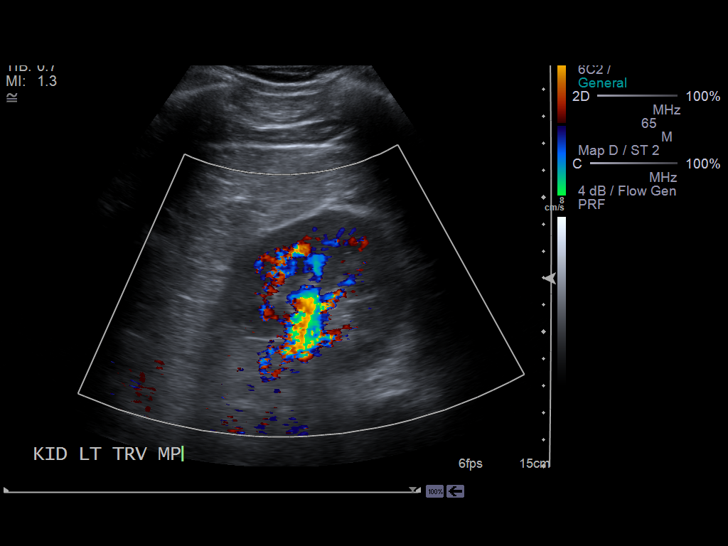
[im 47/71]
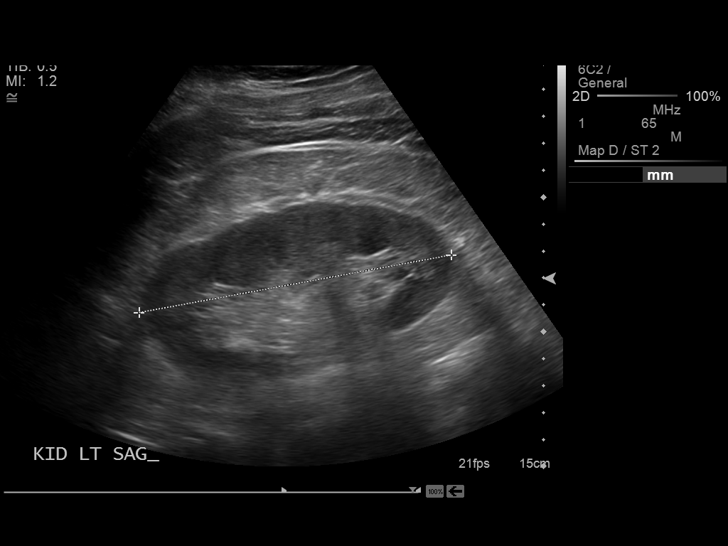
[im 53/71]
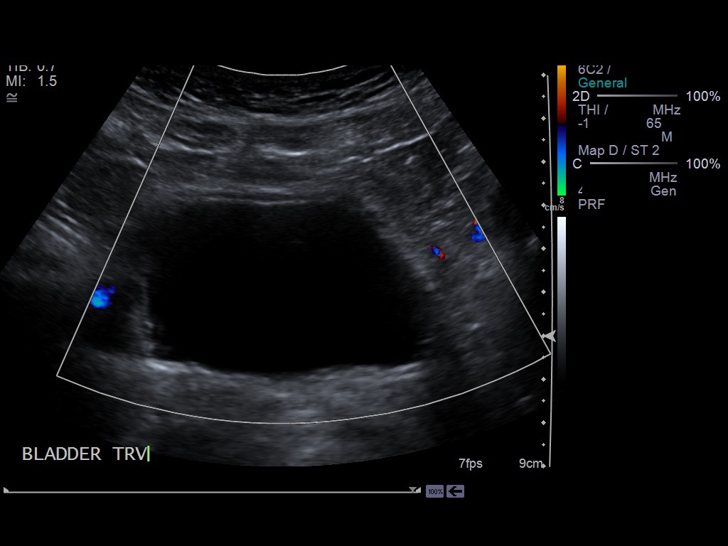
[im 59/71]
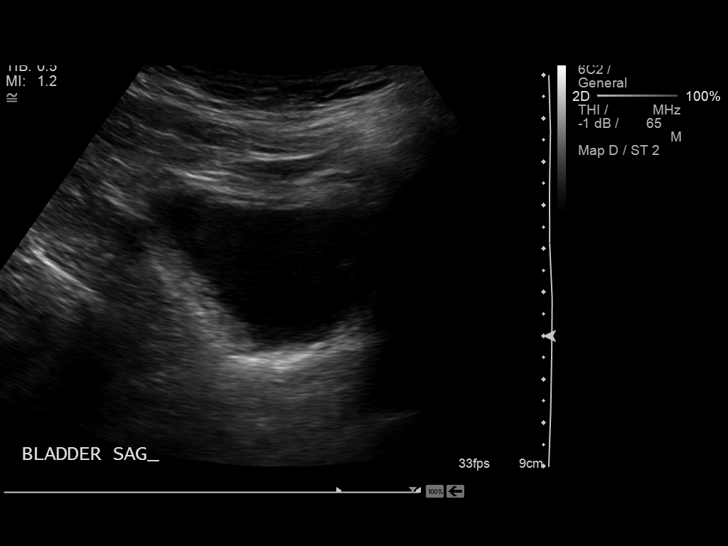
[im 65/71]
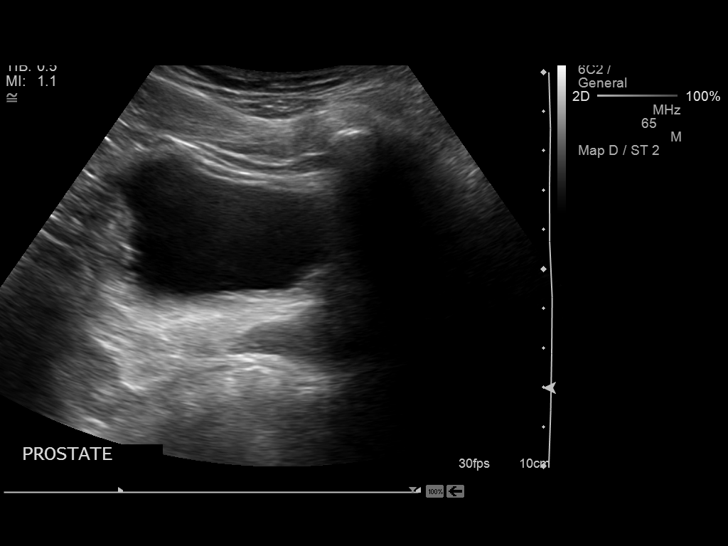
[im 71/71]
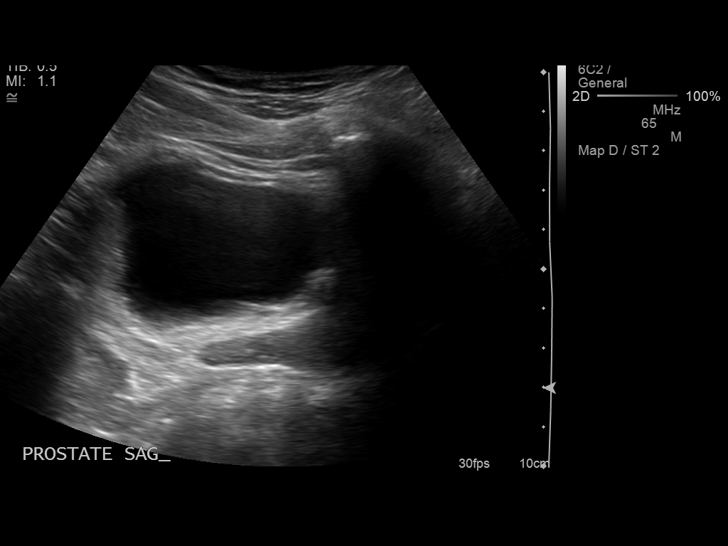

[14 of 25 positions shown; findings below may reference images not displayed]

FINDINGS: Right Kidney:  No hydronephrosis.  Cortical echotexture within
normal limits.  Renal length 12.0 cm.

Left Kidney:  No hydronephrosis.  Echotexture within normal limits.
Renal length 12.1 cm.

Bladder:  Unremarkable.

Other findings:  hepatic steatosis.
IMPRESSION: 1.  No obstructive uropathy.
2.  Hepatic steatosis.

## 2014-12-17 DIAGNOSIS — I639 Cerebral infarction, unspecified: Secondary | ICD-10-CM

## 2014-12-17 HISTORY — DX: Cerebral infarction, unspecified: I63.9

## 2015-01-01 ENCOUNTER — Emergency Department (HOSPITAL_COMMUNITY): Payer: Managed Care, Other (non HMO)

## 2015-01-01 ENCOUNTER — Emergency Department (HOSPITAL_COMMUNITY)
Admission: EM | Admit: 2015-01-01 | Discharge: 2015-01-02 | Discharge status: Against Medical Advice | Payer: Managed Care, Other (non HMO) | Attending: Emergency Medicine | Admitting: Emergency Medicine

## 2015-01-01 ENCOUNTER — Encounter (HOSPITAL_COMMUNITY): Payer: Self-pay | Admitting: *Deleted

## 2015-01-01 DIAGNOSIS — R471 Dysarthria and anarthria: Secondary | ICD-10-CM

## 2015-01-01 DIAGNOSIS — Z8639 Personal history of other endocrine, nutritional and metabolic disease: Secondary | ICD-10-CM | POA: Insufficient documentation

## 2015-01-01 DIAGNOSIS — M1990 Unspecified osteoarthritis, unspecified site: Secondary | ICD-10-CM | POA: Insufficient documentation

## 2015-01-01 DIAGNOSIS — Z7982 Long term (current) use of aspirin: Secondary | ICD-10-CM | POA: Insufficient documentation

## 2015-01-01 DIAGNOSIS — Z8614 Personal history of Methicillin resistant Staphylococcus aureus infection: Secondary | ICD-10-CM | POA: Insufficient documentation

## 2015-01-01 DIAGNOSIS — Z79899 Other long term (current) drug therapy: Secondary | ICD-10-CM | POA: Insufficient documentation

## 2015-01-01 DIAGNOSIS — H539 Unspecified visual disturbance: Secondary | ICD-10-CM | POA: Diagnosis not present

## 2015-01-01 DIAGNOSIS — I10 Essential (primary) hypertension: Secondary | ICD-10-CM | POA: Diagnosis not present

## 2015-01-01 DIAGNOSIS — R531 Weakness: Secondary | ICD-10-CM | POA: Diagnosis present

## 2015-01-01 DIAGNOSIS — G8929 Other chronic pain: Secondary | ICD-10-CM | POA: Diagnosis not present

## 2015-01-01 DIAGNOSIS — Z8673 Personal history of transient ischemic attack (TIA), and cerebral infarction without residual deficits: Secondary | ICD-10-CM | POA: Insufficient documentation

## 2015-01-01 DIAGNOSIS — Z720 Tobacco use: Secondary | ICD-10-CM | POA: Insufficient documentation

## 2015-01-01 DIAGNOSIS — F329 Major depressive disorder, single episode, unspecified: Secondary | ICD-10-CM | POA: Insufficient documentation

## 2015-01-01 LAB — CBC
HCT: 38.9 % — ABNORMAL LOW (ref 39.0–52.0)
HEMOGLOBIN: 13.2 g/dL (ref 13.0–17.0)
MCH: 31.1 pg (ref 26.0–34.0)
MCHC: 33.9 g/dL (ref 30.0–36.0)
MCV: 91.7 fL (ref 78.0–100.0)
PLATELETS: 242 10*3/uL (ref 150–400)
RBC: 4.24 MIL/uL (ref 4.22–5.81)
RDW: 13.9 % (ref 11.5–15.5)
WBC: 8.5 10*3/uL (ref 4.0–10.5)

## 2015-01-01 LAB — DIFFERENTIAL
BASOS PCT: 0 % (ref 0–1)
Basophils Absolute: 0 10*3/uL (ref 0.0–0.1)
Eosinophils Absolute: 0.2 10*3/uL (ref 0.0–0.7)
Eosinophils Relative: 2 % (ref 0–5)
LYMPHS PCT: 14 % (ref 12–46)
Lymphs Abs: 1.2 10*3/uL (ref 0.7–4.0)
Monocytes Absolute: 0.8 10*3/uL (ref 0.1–1.0)
Monocytes Relative: 10 % (ref 3–12)
NEUTROS PCT: 74 % (ref 43–77)
Neutro Abs: 6.2 10*3/uL (ref 1.7–7.7)

## 2015-01-01 NOTE — ED Notes (Addendum)
Pt c/o high blood pressure, slurred speech, dizziness and weakness. Slurred speech started around 2:30 or 3 pm. Pt states he just doesn't feel right.

## 2015-01-01 NOTE — ED Provider Notes (Signed)
CSN: [id number]     Arrival date & time 01/01/15  2245 History  This chart was scribed for Joya Gaskins, MD by Haywood Pao, ED Scribe. The patient was seen in [facility] and the patient's care was started at 11:35 PM.  Chief Complaint  Patient presents with  . Weakness   Patient is a 49 y.o. male presenting with weakness. The history is provided by the patient and the spouse. No language interpreter was used.  Weakness This is a new problem. The current episode started yesterday. The problem occurs constantly. Associated symptoms include chest pain and headaches. Pertinent negatives include no abdominal pain. Nothing aggravates the symptoms. Nothing relieves the symptoms. He has tried nothing for the symptoms.    HPI Comments: Danny Shea is a 49 y.o. male who presents to the Emergency Department complaining of weakness, onset yesterday and has gradually worsened since onset. He has dizziness, fatigue, left sided weakness in his leg, HA, vision loss, mild CP, near syncopal episode, slurred speech, numbness, and dry mouth as associated symptoms. The slurred speech began today.  He denies abdominal pain, nausea, vomiting, and diarrhea.   Past Medical History  Diagnosis Date  . Hypertension   . Hyperlipidemia   . Chest pain     Multiple episodes of chest pain; cath 11/2008: No CAD, normal EF  . Ventricular tachycardia     radiofrequency ablation in 12/2008; H/o myocardial infarction, but no coronary artery disease on coronary angiography or wall motion abnormality on echo  . Syncope     recurrent resulting in hospitalization in 01/2013; hypotensive with acute renal failure, likely medication induced  . Tobacco abuse     45 pack years; 2013-consumption reduced to 0.5 pack per day  . Degenerative joint disease     Lumbosacral spine; chronic low back pain  . MRSA (methicillin resistant Staphylococcus aureus) infection   . Depression   . TIA (transient ischemic attack)   .  Chronic back pain    Past Surgical History  Procedure Laterality Date  . Radiofrequency ablation      Ventricular tachycardia   Family History  Problem Relation Age of Onset  . Heart attack Mother     Coronary artery disease, prior PCI  . Heart failure Mother   . Heart attack Brother     Prior PCI & CHF;  also cousin prior to the age of 60  . Cancer Father     Also mother   History  Substance Use Topics  . Smoking status: Current Every Day Smoker -- 0.50 packs/day    Types: Cigarettes  . Smokeless tobacco: Not on file     Comment: half a pack for 3 months   . Alcohol Use: No     Comment: denies     Review of Systems  Constitutional: Positive for fatigue.  Eyes: Positive for visual disturbance.  Cardiovascular: Positive for chest pain.  Gastrointestinal: Negative for nausea, vomiting, abdominal pain and diarrhea.  Neurological: Positive for dizziness, weakness, numbness and headaches. Negative for syncope.  All other systems reviewed and are negative.   Allergies  Darvocet and Feldene  Home Medications   Prior to Admission medications   Medication Sig Start Date End Date Taking? Authorizing Provider  ALPRAZolam Prudy Feeler) 0.5 MG tablet Take 0.5 mg by mouth 3 (three) times daily as needed.  12/08/12  Yes Historical Provider, MD  amLODipine (NORVASC) 10 MG tablet Take 10 mg by mouth daily.   Yes Historical Provider, MD  aspirin 325 MG tablet Take 325 mg by mouth daily.    Yes Historical Provider, MD  cloNIDine (CATAPRES) 0.1 MG tablet Take 0.1 mg by mouth 3 (three) times daily.   Yes Historical Provider, MD  diphenhydrAMINE (BENADRYL) 50 MG tablet Take 50 mg by mouth at bedtime.   Yes Historical Provider, MD  furosemide (LASIX) 40 MG tablet Take 40 mg by mouth 2 (two) times daily.    Yes Historical Provider, MD  labetalol (NORMODYNE) 100 MG tablet Take 100 mg by mouth 2 (two) times daily.   Yes Historical Provider, MD  losartan (COZAAR) 100 MG tablet Take 100 mg by mouth  daily.  02/03/13  Yes Historical Provider, MD  nitroGLYCERIN (NITROSTAT) 0.4 MG SL tablet Place 0.4 mg under the tongue every 5 (five) minutes as needed.   Yes Historical Provider, MD  omeprazole (PRILOSEC) 40 MG capsule Take 40 mg by mouth daily.   Yes Historical Provider, MD  OxyCODONE (OXYCONTIN) 10 mg T12A 12 hr tablet Take 10 mg by mouth every 12 (twelve) hours.   Yes Historical Provider, MD  OxyCODONE (OXYCONTIN) 20 mg T12A 12 hr tablet Take 20 mg by mouth 4 (four) times daily.   Yes Historical Provider, MD  promethazine (PHENERGAN) 25 MG tablet Take 25 mg by mouth every 8 (eight) hours as needed for nausea.    Yes Historical Provider, MD  spironolactone (ALDACTONE) 50 MG tablet Take 50 mg by mouth 2 (two) times daily.   Yes Historical Provider, MD  HYDROcodone-acetaminophen (NORCO) 7.5-325 MG per tablet Take 1 tablet by mouth every 4 (four) hours as needed for moderate pain. 07/17/14   Tammy L. Triplett, PA-C   BP 147/89 mmHg  Pulse 71  Temp(Src) 98.6 F (37 C) (Oral)  Resp 18  Ht 5\' 11"  (1.803 m)  Wt 196 lb (88.905 kg)  BMI 27.35 kg/m2  SpO2 98% Physical Exam  Nursing note and vitals reviewed.  CONSTITUTIONAL: Well developed/well nourished HEAD: Normocephalic/atraumatic EYES: EOMI/PERRL, no nystagmus,  no ptosis ENMT: Mucous membranes moist NECK: supple no meningeal signs, no bruits CV: S1/S2 noted, no murmurs/rubs/gallops noted LUNGS: Lungs are clear to auscultation bilaterally, no apparent distress ABDOMEN: soft, nontender, no rebound or guarding GU:no cva tenderness NEURO:Awake/alert, facies symmetric, no arm or leg drift is noted Equal 5/5 strength with shoulder abduction, elbow flex/extension, wrist flex/extension in upper extremities and equal hand grips bilaterally Equal 5/5 strength with hip flexion,knee flex/extension, foot dorsi/plantar flexion Cranial nerves 3/4/5/6/06/24/09/11/12 tested and intact Sensation to light touch intact in all extremities Mild dysarthria  noted EXTREMITIES: pulses normal, full ROM SKIN: warm, color normal PSYCH: no abnormalities of mood noted   ED Course  Procedures  DIAGNOSTIC STUDIES: Oxygen Saturation is 98% on room air, normal by my interpretation.    COORDINATION OF CARE: 11:39 PM Discussed treatment plan with pt at bedside and pt agreed to plan. tPA in stroke considered but not given due to: Onset over 3-4.5hours  Discussed findings with patient He appears improved - no focal weakness and speech is improved However still concern for possible stroke He would like to leave due to work obligations I discussed at length with patient/wife risk of leaving including death/disability or consequences that can not be predicted   I discussed risk of death/disability of leaving against medical advice and the patient accepts these risks.  The patient is awake/alert able to make decisions, and does not appear intoxicated Patient discharged against medical advice. Advised he can return at anytime.  Discussed need  for close PCP evaluation  Labs Review Labs Reviewed  CBC - Abnormal; Notable for the following:    HCT 38.9 (*)    All other components within normal limits  COMPREHENSIVE METABOLIC PANEL - Abnormal; Notable for the following:    Potassium 3.4 (*)    Total Protein 5.9 (*)    All other components within normal limits  ETHANOL  PROTIME-INR  APTT  DIFFERENTIAL  TROPONIN I    Imaging Review Ct Head Wo Contrast  01/02/2015   CLINICAL DATA:  Slurred speech, dizziness and weakness beginning this afternoon.  EXAM: CT HEAD WITHOUT CONTRAST  TECHNIQUE: Contiguous axial images were obtained from the base of the skull through the vertex without intravenous contrast.  COMPARISON:  None.  FINDINGS: The ventricles and sulci are normal. No intraparenchymal hemorrhage, mass effect nor midline shift. No acute large vascular territory infarcts.  No abnormal extra-axial fluid collections. Basal cisterns are patent. Mild  calcific atherosclerosis of the carotid siphons.  No skull fracture. The included ocular globes and orbital contents are non-suspicious. The mastoid aircells and included paranasal sinuses are well-aerated.  IMPRESSION: No acute intracranial process ; normal noncontrast CT of the head for age.   Electronically Signed   By: Awilda Metro   On: 01/02/2015 00:38     EKG Interpretation   Date/Time:  Saturday January 01 2015 23:32:23 EST Ventricular Rate:  75 PR Interval:  132 QRS Duration: 94 QT Interval:  403 QTC Calculation: 450 R Axis:   67 Text Interpretation:  Sinus rhythm Non-specific ST-t changes No  significant change since last tracing Confirmed by Bebe Shaggy  MD, Dorinda Hill  [phone]) on 01/01/2015 11:59:07 PM      MDM   Final diagnoses:  Dysarthria  Essential hypertension    Nursing notes including past medical history and social history reviewed and considered in documentation  Labs/vital reviewed myself and considered during evaluation   I personally performed the services described in this documentation, which was scribed in my presence. The recorded information has been reviewed and is accurate.      Joya Gaskins, MD 01/02/15 [phone]

## 2015-01-01 NOTE — ED Notes (Signed)
Family at bedside. EKG done and given to EDP. Patient placed on cardiac monitor at this time. MD at bedside also.

## 2015-01-02 LAB — COMPREHENSIVE METABOLIC PANEL
ALT: 16 U/L (ref 0–53)
AST: 17 U/L (ref 0–37)
Albumin: 3.7 g/dL (ref 3.5–5.2)
Alkaline Phosphatase: 70 U/L (ref 39–117)
Anion gap: 6 (ref 5–15)
BILIRUBIN TOTAL: 0.3 mg/dL (ref 0.3–1.2)
BUN: 14 mg/dL (ref 6–23)
CALCIUM: 8.7 mg/dL (ref 8.4–10.5)
CO2: 27 mmol/L (ref 19–32)
CREATININE: 0.99 mg/dL (ref 0.50–1.35)
Chloride: 107 mEq/L (ref 96–112)
GFR calc Af Amer: >90 mL/min (ref >90)
GFR calc non Af Amer: >90 mL/min (ref >90)
Glucose, Bld: 92 mg/dL (ref 70–99)
Potassium: 3.4 mmol/L — ABNORMAL LOW (ref 3.5–5.1)
SODIUM: 140 mmol/L (ref 135–145)
TOTAL PROTEIN: 5.9 g/dL — AB (ref 6.0–8.3)

## 2015-01-02 LAB — TROPONIN I: Troponin I: <0.03 ng/mL (ref <0.031)

## 2015-01-02 LAB — APTT: APTT: 29 s (ref 24–37)

## 2015-01-02 LAB — PROTIME-INR
INR: 0.98 (ref 0.00–1.49)
Prothrombin Time: 13.1 seconds (ref 11.6–15.2)

## 2015-01-02 LAB — ETHANOL: Alcohol, Ethyl (B): <5 mg/dL (ref 0–9)

## 2015-01-02 NOTE — ED Notes (Signed)
Pt left AMA, stated he had to be in [place] for a job tomorrow. Explained risks of leaving and pt expressed verbal understanding.

## 2015-01-05 ENCOUNTER — Encounter (HOSPITAL_COMMUNITY): Payer: Self-pay | Admitting: *Deleted

## 2015-01-05 ENCOUNTER — Inpatient Hospital Stay (HOSPITAL_COMMUNITY): Payer: Managed Care, Other (non HMO)

## 2015-01-05 ENCOUNTER — Inpatient Hospital Stay (HOSPITAL_COMMUNITY)
Admission: EM | Admit: 2015-01-05 | Discharge: 2015-01-06 | DRG: 066 | Disposition: A | Discharge status: Home / Self Care | Payer: Managed Care, Other (non HMO) | Attending: Family Medicine | Admitting: Family Medicine

## 2015-01-05 ENCOUNTER — Emergency Department (HOSPITAL_COMMUNITY): Payer: Managed Care, Other (non HMO)

## 2015-01-05 DIAGNOSIS — I10 Essential (primary) hypertension: Secondary | ICD-10-CM | POA: Diagnosis present

## 2015-01-05 DIAGNOSIS — Z8673 Personal history of transient ischemic attack (TIA), and cerebral infarction without residual deficits: Secondary | ICD-10-CM

## 2015-01-05 DIAGNOSIS — Z7982 Long term (current) use of aspirin: Secondary | ICD-10-CM

## 2015-01-05 DIAGNOSIS — I252 Old myocardial infarction: Secondary | ICD-10-CM | POA: Diagnosis not present

## 2015-01-05 DIAGNOSIS — G8929 Other chronic pain: Secondary | ICD-10-CM | POA: Diagnosis present

## 2015-01-05 DIAGNOSIS — I639 Cerebral infarction, unspecified: Secondary | ICD-10-CM | POA: Diagnosis present

## 2015-01-05 DIAGNOSIS — Z791 Long term (current) use of non-steroidal anti-inflammatories (NSAID): Secondary | ICD-10-CM | POA: Diagnosis not present

## 2015-01-05 DIAGNOSIS — Z888 Allergy status to other drugs, medicaments and biological substances status: Secondary | ICD-10-CM | POA: Diagnosis not present

## 2015-01-05 DIAGNOSIS — Z809 Family history of malignant neoplasm, unspecified: Secondary | ICD-10-CM | POA: Diagnosis not present

## 2015-01-05 DIAGNOSIS — Z87898 Personal history of other specified conditions: Secondary | ICD-10-CM | POA: Diagnosis not present

## 2015-01-05 DIAGNOSIS — M47896 Other spondylosis, lumbar region: Secondary | ICD-10-CM | POA: Diagnosis present

## 2015-01-05 DIAGNOSIS — Z885 Allergy status to narcotic agent status: Secondary | ICD-10-CM

## 2015-01-05 DIAGNOSIS — R4701 Aphasia: Secondary | ICD-10-CM | POA: Insufficient documentation

## 2015-01-05 DIAGNOSIS — Z720 Tobacco use: Secondary | ICD-10-CM | POA: Diagnosis not present

## 2015-01-05 DIAGNOSIS — Z8249 Family history of ischemic heart disease and other diseases of the circulatory system: Secondary | ICD-10-CM | POA: Diagnosis not present

## 2015-01-05 DIAGNOSIS — E785 Hyperlipidemia, unspecified: Secondary | ICD-10-CM | POA: Diagnosis present

## 2015-01-05 LAB — CBC WITH DIFFERENTIAL/PLATELET
BASOS ABS: 0 10*3/uL (ref 0.0–0.1)
Basophils Relative: 1 % (ref 0–1)
Eosinophils Absolute: 0.2 10*3/uL (ref 0.0–0.7)
Eosinophils Relative: 3 % (ref 0–5)
HEMATOCRIT: 39.2 % (ref 39.0–52.0)
HEMOGLOBIN: 13.2 g/dL (ref 13.0–17.0)
Lymphocytes Relative: 17 % (ref 12–46)
Lymphs Abs: 1.2 10*3/uL (ref 0.7–4.0)
MCH: 30.8 pg (ref 26.0–34.0)
MCHC: 33.7 g/dL (ref 30.0–36.0)
MCV: 91.6 fL (ref 78.0–100.0)
MONO ABS: 0.7 10*3/uL (ref 0.1–1.0)
Monocytes Relative: 9 % (ref 3–12)
Neutro Abs: 4.8 10*3/uL (ref 1.7–7.7)
Neutrophils Relative %: 70 % (ref 43–77)
Platelets: 246 10*3/uL (ref 150–400)
RBC: 4.28 MIL/uL (ref 4.22–5.81)
RDW: 13.7 % (ref 11.5–15.5)
WBC: 6.9 10*3/uL (ref 4.0–10.5)

## 2015-01-05 LAB — URINALYSIS, ROUTINE W REFLEX MICROSCOPIC
Bilirubin Urine: NEGATIVE
GLUCOSE, UA: NEGATIVE mg/dL
Ketones, ur: NEGATIVE mg/dL
Leukocytes, UA: NEGATIVE
Nitrite: NEGATIVE
Protein, ur: NEGATIVE mg/dL
Specific Gravity, Urine: 1.015 (ref 1.005–1.030)
UROBILINOGEN UA: 0.2 mg/dL (ref 0.0–1.0)
pH: 7 (ref 5.0–8.0)

## 2015-01-05 LAB — TROPONIN I: Troponin I: <0.03 ng/mL (ref <0.031)

## 2015-01-05 LAB — URINE MICROSCOPIC-ADD ON

## 2015-01-05 LAB — BASIC METABOLIC PANEL
ANION GAP: 6 (ref 5–15)
BUN: 11 mg/dL (ref 6–23)
CHLORIDE: 106 meq/L (ref 96–112)
CO2: 29 mmol/L (ref 19–32)
Calcium: 9.3 mg/dL (ref 8.4–10.5)
Creatinine, Ser: 1.02 mg/dL (ref 0.50–1.35)
GFR calc Af Amer: >90 mL/min (ref >90)
GFR calc non Af Amer: 84 mL/min — ABNORMAL LOW (ref >90)
Glucose, Bld: 98 mg/dL (ref 70–99)
POTASSIUM: 3.8 mmol/L (ref 3.5–5.1)
SODIUM: 141 mmol/L (ref 135–145)

## 2015-01-05 MED ORDER — PANTOPRAZOLE SODIUM 40 MG PO TBEC
40.0000 mg | DELAYED_RELEASE_TABLET | Freq: Every day | ORAL | Status: DC
  Administered 2015-01-06: 40 mg via ORAL
  Filled 2015-01-05: qty 1

## 2015-01-05 MED ORDER — ENOXAPARIN SODIUM 30 MG/0.3ML ~~LOC~~ SOLN
30.0000 mg | SUBCUTANEOUS | Status: DC
  Administered 2015-01-06: 30 mg via SUBCUTANEOUS
  Filled 2015-01-05: qty 0.3

## 2015-01-05 MED ORDER — NICOTINE 21 MG/24HR TD PT24
21.0000 mg | MEDICATED_PATCH | Freq: Every day | TRANSDERMAL | Status: DC
  Administered 2015-01-06 (×2): 21 mg via TRANSDERMAL
  Filled 2015-01-05 (×2): qty 1

## 2015-01-05 MED ORDER — SENNOSIDES-DOCUSATE SODIUM 8.6-50 MG PO TABS
1.0000 | ORAL_TABLET | Freq: Every evening | ORAL | Status: DC | PRN
Start: 2015-01-05 — End: 2015-01-06
  Administered 2015-01-06: 1 via ORAL
  Filled 2015-01-05 (×2): qty 1

## 2015-01-05 MED ORDER — ASPIRIN 300 MG RE SUPP
300.0000 mg | Freq: Every day | RECTAL | Status: DC
  Filled 2015-01-05 (×2): qty 1

## 2015-01-05 MED ORDER — AMLODIPINE BESYLATE 5 MG PO TABS
10.0000 mg | ORAL_TABLET | Freq: Every day | ORAL | Status: DC
  Administered 2015-01-06: 10 mg via ORAL
  Filled 2015-01-05: qty 2

## 2015-01-05 MED ORDER — ASPIRIN 325 MG PO TABS
325.0000 mg | ORAL_TABLET | Freq: Every day | ORAL | Status: DC
  Administered 2015-01-06 (×2): 325 mg via ORAL
  Filled 2015-01-05 (×2): qty 1

## 2015-01-05 MED ORDER — STROKE: EARLY STAGES OF RECOVERY BOOK
Freq: Once | Status: AC
  Administered 2015-01-06: 10:00:00
  Filled 2015-01-05: qty 1

## 2015-01-05 NOTE — H&P (Signed)
Hospitalist Admission History and Physical  Patient name: Danny Shea Medical record number: [id number] Date of birth: [birth year] Age: 49 y.o. Gender: male  Primary Care Provider: Kendell Bane, MD  Chief Complaint: CVA History of Present Illness:This is a 49 y.o. year old male with significant past medical history of HTN, tobacco abuse, arrhythmia s/p ablation presenting with CVA. Pt reports L sided weakness over the last 3-4 weeks. Associated sxs include slurred speech. Was seen in the ER over the weekend for sxs. Was advised to come in for admission. However, pt refused. Sxs persisted and pt came in for reevaluation. Smokes 1 1/2 PPD. Reports uncontrolled HTN.  Presents to ER afebrile, BP 160s-180s. CBC and CMET WNL. Head CT WNL.   Assessment and Plan: Danny Shea is a 49 y.o. year old male presenting with CVA  Active Problems:   CVA (cerebral infarction)   1- CVA -proceed down stroke pathway including MRI, MRA, 2D ECHO, risk stratification labs, carotid dopplers  -full dose ASA -quit smoking  2- HTN -elevated BP  -norvasc -follow   FEN/GI: heart healthy diet pending bedside swallow eval  Prophylaxis: lovenox  Disposition: ppending further evaluation  Code Status:Full Code    Patient Active Problem List   Diagnosis Date Noted  . Chest pain   . TIA (transient ischemic attack)   . Hypertension   . Hyperlipidemia   . Stroke   . Ventricular tachycardia   . Syncope   . Tobacco abuse    Past Medical History: Past Medical History  Diagnosis Date  . Hypertension   . Hyperlipidemia   . Chest pain     Multiple episodes of chest pain; cath 11/2008: No CAD, normal EF  . Ventricular tachycardia     radiofrequency ablation in 12/2008; H/o myocardial infarction, but no coronary artery disease on coronary angiography or wall motion abnormality on echo  . Syncope     recurrent resulting in hospitalization in 01/2013; hypotensive with acute renal failure, likely  medication induced  . Tobacco abuse     45 pack years; 2013-consumption reduced to 0.5 pack per day  . Degenerative joint disease     Lumbosacral spine; chronic low back pain  . MRSA (methicillin resistant Staphylococcus aureus) infection   . Depression   . TIA (transient ischemic attack)   . Chronic back pain     Past Surgical History: Past Surgical History  Procedure Laterality Date  . Radiofrequency ablation      Ventricular tachycardia    Social History: History   Social History  . Marital Status: Single    Spouse Name: N/A    Number of Children: N/A  . Years of Education: N/A   Occupational History  . Unemployed     secondary to chronic low back pain   Social History Main Topics  . Smoking status: Current Every Day Smoker -- 0.50 packs/day    Types: Cigarettes  . Smokeless tobacco: None     Comment: half a pack for 3 months   . Alcohol Use: No     Comment: denies   . Drug Use: No     Comment: Remote; recent use of marijuana, denies any use 07/17/2014  . Sexual Activity: None   Other Topics Concern  . None   Social History Narrative    Family History: Family History  Problem Relation Age of Onset  . Heart attack Mother     Coronary artery disease, prior PCI  . Heart failure Mother   .  Heart attack Brother     Prior PCI & CHF;  also cousin prior to the age of 51  . Cancer Father     Also mother    Allergies: Allergies  Allergen Reactions  . Darvocet [Propoxyphene N-Acetaminophen] Other (See Comments)    Unknown reaction  . Feldene [Piroxicam] Other (See Comments)    Unknown reaction    Current Facility-Administered Medications  Medication Dose Route Frequency Provider Last Rate Last Dose  .  stroke: mapping our early stages of recovery book   Does not apply Once Doree AlbeeSteven Jahniyah Revere, MD      . aspirin suppository 300 mg  300 mg Rectal Daily Doree AlbeeSteven Rajendra Spiller, MD       Or  . aspirin tablet 325 mg  325 mg Oral Daily Doree AlbeeSteven Shanon Seawright, MD      . enoxaparin  (LOVENOX) injection 30 mg  30 mg Subcutaneous Q24H Doree AlbeeSteven Maurisha Mongeau, MD      . nicotine (NICODERM CQ - dosed in mg/24 hours) patch 21 mg  21 mg Transdermal Daily Doree AlbeeSteven Maleni Seyer, MD      . senna-docusate (Senokot-S) tablet 1 tablet  1 tablet Oral QHS PRN Doree AlbeeSteven Damire Remedios, MD       Current Outpatient Prescriptions  Medication Sig Dispense Refill  . ALPRAZolam (XANAX) 0.5 MG tablet Take 0.5 mg by mouth 3 (three) times daily as needed.     Marland Kitchen. amLODipine (NORVASC) 10 MG tablet Take 10 mg by mouth daily.    Marland Kitchen. aspirin EC 81 MG tablet Take 81 mg by mouth daily.    . cloNIDine (CATAPRES) 0.1 MG tablet Take 0.1 mg by mouth 3 (three) times daily.    . diphenhydrAMINE (BENADRYL) 50 MG tablet Take 50 mg by mouth at bedtime.    . furosemide (LASIX) 20 MG tablet Take 20 mg by mouth 2 (two) times daily as needed for fluid.    Marland Kitchen. labetalol (NORMODYNE) 100 MG tablet Take 100 mg by mouth 2 (two) times daily.    Marland Kitchen. losartan (COZAAR) 100 MG tablet Take 100 mg by mouth daily.     . meloxicam (MOBIC) 15 MG tablet Take 15 mg by mouth daily.    Marland Kitchen. omeprazole (PRILOSEC) 40 MG capsule Take 40 mg by mouth daily.    . OxyCODONE (OXYCONTIN) 10 mg T12A 12 hr tablet Take 10 mg by mouth every 12 (twelve) hours.    . Oxycodone HCl 20 MG TABS Take 20 mg by mouth every 4 (four) hours as needed (for pain).    . promethazine (PHENERGAN) 25 MG tablet Take 25 mg by mouth every 8 (eight) hours as needed for nausea.     Marland Kitchen. spironolactone (ALDACTONE) 50 MG tablet Take 50 mg by mouth 2 (two) times daily.    Marland Kitchen. HYDROcodone-acetaminophen (NORCO) 7.5-325 MG per tablet Take 1 tablet by mouth every 4 (four) hours as needed for moderate pain. (Patient not taking: Reported on 01/05/2015) 15 tablet 0  . nitroGLYCERIN (NITROSTAT) 0.4 MG SL tablet Place 0.4 mg under the tongue every 5 (five) minutes as needed.     Review Of Systems: 12 point ROS negative except as noted above in HPI.  Physical Exam: Filed Vitals:   01/05/15 2146  BP: 180/78  Pulse: 75   Temp:   Resp: 20    General: alert and cooperative HEENT: PERRLA and extra ocular movement intact Heart: S1, S2 normal, no murmur, rub or gallop, regular rate and rhythm Lungs: clear to auscultation, no wheezes or rales and unlabored breathing  Abdomen: abdomen is soft without significant tenderness, masses, organomegaly or guarding Extremities: mild L sided weakness Skin:no rashes Neurology: mild L sided weakness, otherwise normal exam  Labs and Imaging: Lab Results  Component Value Date/Time   NA 141 01/05/2015 08:03 PM   K 3.8 01/05/2015 08:03 PM   CL 106 01/05/2015 08:03 PM   CO2 29 01/05/2015 08:03 PM   BUN 11 01/05/2015 08:03 PM   CREATININE 1.02 01/05/2015 08:03 PM   CREATININE 1.08 02/17/2013 02:35 PM   GLUCOSE 98 01/05/2015 08:03 PM   Lab Results  Component Value Date   WBC 6.9 01/05/2015   HGB 13.2 01/05/2015   HCT 39.2 01/05/2015   MCV 91.6 01/05/2015   PLT 246 01/05/2015    Ct Head Wo Contrast  01/05/2015   CLINICAL DATA:  Dysarthria and dizziness with visual disturbance and left-sided weakness  EXAM: CT HEAD WITHOUT CONTRAST  TECHNIQUE: Contiguous axial images were obtained from the base of the skull through the vertex without intravenous contrast.  COMPARISON:  January 01, 2015  FINDINGS: The ventricles are normal in size and configuration. There is no mass, hemorrhage, extra-axial fluid collection, or midline shift. The gray-white compartments are normal. No acute infarct is apparent. Bony calvarium appears intact. The mastoid air cells are clear. There is rightward deviation of the nasal septum, stable.  IMPRESSION: No intracranial mass, hemorrhage, or focal gray -white compartment lesion/acute appearing infarct. No appreciable change compared to recent prior study.   Electronically Signed   By: Bretta Bang M.D.   On: 01/05/2015 20:41           Doree Albee MD  Pager: [phone]

## 2015-01-05 NOTE — ED Provider Notes (Signed)
CSN: [id number]     Arrival date & time 01/05/15  1926 History   This chart was scribed for Rolland Porter, MD by Gwenyth Ober, ED Scribe. This patient was seen in room [facility]12/[facility]12 and the patient's care was started at 7:56 PM.    Chief Complaint  Patient presents with  . Aphasia   The history is provided by the patient. No language interpreter was used.    HPI Comments: Danny Shea is a 49 y.o. male with a history of HTN, hyperlipidemia, CP, v-tac, DDD and TIA who presents to the Emergency Department complaining of intermittent weakness and speech difficulty that started a few weeks ago. He states dizziness, light-headedness, bilateral visual disturbance, left-sided weakeness, slurred speech, difficulty forming words, numbness in his left fingers and hand, intermittent drooling and HA that started 1 week ago as associated symptoms. He has tried [employer] and Ibuprofen for the pain. Pt was seen in the ED on 1/16 for similar symptoms and left AMA with diagnosis of dysarthria and suspicion for CVA. Pt has a history of heart ablation for ventricular tachycardia, but denies known blockages. He also has a family history of stroke. Pt smokes 1.5 ppd.   Past Medical History  Diagnosis Date  . Hypertension   . Hyperlipidemia   . Chest pain     Multiple episodes of chest pain; cath 11/2008: No CAD, normal EF  . Ventricular tachycardia     radiofrequency ablation in 12/2008; H/o myocardial infarction, but no coronary artery disease on coronary angiography or wall motion abnormality on echo  . Syncope     recurrent resulting in hospitalization in 01/2013; hypotensive with acute renal failure, likely medication induced  . Tobacco abuse     45 pack years; 2013-consumption reduced to 0.5 pack per day  . Degenerative joint disease     Lumbosacral spine; chronic low back pain  . MRSA (methicillin resistant Staphylococcus aureus) infection   . Depression   . TIA (transient ischemic attack)   .  Chronic back pain    Past Surgical History  Procedure Laterality Date  . Radiofrequency ablation      Ventricular tachycardia   Family History  Problem Relation Age of Onset  . Heart attack Mother     Coronary artery disease, prior PCI  . Heart failure Mother   . Heart attack Brother     Prior PCI & CHF;  also cousin prior to the age of 44  . Cancer Father     Also mother   History  Substance Use Topics  . Smoking status: Current Every Day Smoker -- 0.50 packs/day    Types: Cigarettes  . Smokeless tobacco: Not on file     Comment: half a pack for 3 months   . Alcohol Use: No     Comment: denies     Review of Systems  Constitutional: Negative for fever, chills, diaphoresis, appetite change and fatigue.  HENT: Positive for drooling. Negative for mouth sores, sore throat and trouble swallowing.   Eyes: Positive for visual disturbance.  Respiratory: Negative for cough, chest tightness, shortness of breath and wheezing.   Cardiovascular: Negative for chest pain.  Gastrointestinal: Negative for nausea, vomiting, abdominal pain, diarrhea and abdominal distention.  Endocrine: Negative for polydipsia, polyphagia and polyuria.  Genitourinary: Negative for dysuria, frequency and hematuria.  Musculoskeletal: Negative for gait problem.  Skin: Negative for color change, pallor and rash.  Neurological: Positive for dizziness, speech difficulty, weakness, light-headedness, numbness and headaches. Negative for  syncope.  Hematological: Does not bruise/bleed easily.  Psychiatric/Behavioral: Negative for behavioral problems and confusion.      Allergies  Darvocet; Feldene; and Penicillins  Home Medications   Prior to Admission medications   Medication Sig Start Date End Date Taking? Authorizing Provider  ALPRAZolam Prudy Feeler(XANAX) 0.5 MG tablet Take 0.5 mg by mouth 3 (three) times daily as needed.  12/08/12  Yes Historical Provider, MD  amLODipine (NORVASC) 10 MG tablet Take 10 mg by mouth  daily.   Yes Historical Provider, MD  aspirin EC 81 MG tablet Take 81 mg by mouth daily.   Yes Historical Provider, MD  cloNIDine (CATAPRES) 0.1 MG tablet Take 0.1 mg by mouth 3 (three) times daily.   Yes Historical Provider, MD  diphenhydrAMINE (BENADRYL) 50 MG tablet Take 50 mg by mouth at bedtime.   Yes Historical Provider, MD  furosemide (LASIX) 20 MG tablet Take 20 mg by mouth 2 (two) times daily as needed for fluid.   Yes Historical Provider, MD  labetalol (NORMODYNE) 100 MG tablet Take 100 mg by mouth 2 (two) times daily.   Yes Historical Provider, MD  losartan (COZAAR) 100 MG tablet Take 100 mg by mouth daily.  02/03/13  Yes Historical Provider, MD  meloxicam (MOBIC) 15 MG tablet Take 15 mg by mouth daily. 12/31/14  Yes Historical Provider, MD  omeprazole (PRILOSEC) 40 MG capsule Take 40 mg by mouth daily.   Yes Historical Provider, MD  OxyCODONE (OXYCONTIN) 10 mg T12A 12 hr tablet Take 10 mg by mouth every 12 (twelve) hours.   Yes Historical Provider, MD  Oxycodone HCl 20 MG TABS Take 20 mg by mouth every 4 (four) hours as needed (for pain).   Yes Historical Provider, MD  promethazine (PHENERGAN) 25 MG tablet Take 25 mg by mouth every 8 (eight) hours as needed for nausea.    Yes Historical Provider, MD  spironolactone (ALDACTONE) 50 MG tablet Take 50 mg by mouth 2 (two) times daily.   Yes Historical Provider, MD  HYDROcodone-acetaminophen (NORCO) 7.5-325 MG per tablet Take 1 tablet by mouth every 4 (four) hours as needed for moderate pain. Patient not taking: Reported on 01/05/2015 07/17/14   Tammy L. Triplett, PA-C  nitroGLYCERIN (NITROSTAT) 0.4 MG SL tablet Place 0.4 mg under the tongue every 5 (five) minutes as needed.    Historical Provider, MD   BP 166/91 mmHg  Pulse 73  Temp(Src) 98.6 F (37 C) (Oral)  Resp 20  Ht 5\' 11"  (1.803 m)  Wt 197 lb (89.359 kg)  BMI 27.49 kg/m2  SpO2 99% Physical Exam  Constitutional: He is oriented to person, place, and time. He appears well-developed  and well-nourished. No distress.  HENT:  Head: Normocephalic.  Eyes: Conjunctivae are normal. Pupils are equal, round, and reactive to light. No scleral icterus.  Neck: Normal range of motion. Neck supple. No thyromegaly present.  No JVD  Cardiovascular: Normal rate and regular rhythm.  Exam reveals no gallop and no friction rub.   No murmur heard. Sinus rhythm; no murmurs; no bruits  Pulmonary/Chest: Effort normal and breath sounds normal. No respiratory distress. He has no wheezes. He has no rales.  Lungs clear  Abdominal: Soft. Bowel sounds are normal. He exhibits no distension. There is no tenderness. There is no rebound.  Musculoskeletal: Normal range of motion.  Neurological: He is alert and oriented to person, place, and time.  Normal cranial nerves; left pronator drift; intermittent aphasia  Skin: Skin is warm and dry. No rash noted.  Psychiatric: He has a normal mood and affect. His behavior is normal.  Nursing note and vitals reviewed.   ED Course  Procedures (including critical care time) DIAGNOSTIC STUDIES: Oxygen Saturation is 98% on RA, normal by my interpretation.    COORDINATION OF CARE: 8:11 PM Discussed treatment plan with pt which includes CT Head, UA and lab work. Pt agreed to plan.   Labs Review Labs Reviewed  URINALYSIS, ROUTINE W REFLEX MICROSCOPIC - Abnormal; Notable for the following:    Hgb urine dipstick TRACE (*)    All other components within normal limits  BASIC METABOLIC PANEL - Abnormal; Notable for the following:    GFR calc non Af Amer 84 (*)    All other components within normal limits  URINE CULTURE  CBC WITH DIFFERENTIAL  TROPONIN I  URINE MICROSCOPIC-ADD ON  HEMOGLOBIN A1C  LIPID PANEL    Imaging Review Dg Chest 2 View  01/05/2015   CLINICAL DATA:  Generalized weakness and slurred speech for 1 week. History of hypertension, TIA as well as history of smoking and ventricular tachycardia.  EXAM: CHEST  2 VIEW  COMPARISON:  07/03/2013   FINDINGS: Cardiac silhouette normal size configuration. Normal mediastinal and hilar contours.  There is central vascular congestion and mild bilateral interstitial thickening which has developed since the prior study. No focal consolidation. No pleural effusion or pneumothorax. Lungs are mildly hyperexpanded.  Bony thorax is demineralized but intact.  IMPRESSION: Mild vascular congestion and interstitial thickening. Findings suggest mild interstitial pulmonary edema. No evidence of pneumonia.   Electronically Signed   By: Amie Portland M.D.   On: 01/05/2015 22:19   Ct Head Wo Contrast  01/05/2015   CLINICAL DATA:  Dysarthria and dizziness with visual disturbance and left-sided weakness  EXAM: CT HEAD WITHOUT CONTRAST  TECHNIQUE: Contiguous axial images were obtained from the base of the skull through the vertex without intravenous contrast.  COMPARISON:  January 01, 2015  FINDINGS: The ventricles are normal in size and configuration. There is no mass, hemorrhage, extra-axial fluid collection, or midline shift. The gray-white compartments are normal. No acute infarct is apparent. Bony calvarium appears intact. The mastoid air cells are clear. There is rightward deviation of the nasal septum, stable.  IMPRESSION: No intracranial mass, hemorrhage, or focal gray -white compartment lesion/acute appearing infarct. No appreciable change compared to recent prior study.   Electronically Signed   By: Bretta Bang M.D.   On: 01/05/2015 20:41     EKG Interpretation None      MDM   Final diagnoses:  Aphasia   CT shows no acute findings. I discussed the case with Dr. Alvester Morin of [facility]. Patient is agreeable to stay for further testing.   Rolland Porter, MD 01/05/15 2308

## 2015-01-05 NOTE — ED Notes (Signed)
Pt evaluated last week for similar symptoms.  Pt left AMA at that time.  Wife reporting continuation of pt having periods of increased drowsiness, slurred speech and difficulty making self understoon.

## 2015-01-05 NOTE — ED Notes (Signed)
Pt here Saturday night for vision changes and weakness, slurred speech, pt left and insisted on leaving to go to [place] and has gotten back , with continued slurred speech and wife states pt had drooling on Sunday

## 2015-01-06 ENCOUNTER — Inpatient Hospital Stay (HOSPITAL_COMMUNITY): Payer: Managed Care, Other (non HMO)

## 2015-01-06 ENCOUNTER — Encounter (HOSPITAL_COMMUNITY): Payer: Self-pay

## 2015-01-06 DIAGNOSIS — R4701 Aphasia: Secondary | ICD-10-CM | POA: Insufficient documentation

## 2015-01-06 DIAGNOSIS — E785 Hyperlipidemia, unspecified: Secondary | ICD-10-CM

## 2015-01-06 DIAGNOSIS — I639 Cerebral infarction, unspecified: Principal | ICD-10-CM

## 2015-01-06 DIAGNOSIS — I10 Essential (primary) hypertension: Secondary | ICD-10-CM

## 2015-01-06 LAB — LIPID PANEL
Cholesterol: 181 mg/dL (ref 0–200)
HDL: 34 mg/dL — ABNORMAL LOW (ref >39)
LDL Cholesterol: 126 mg/dL — ABNORMAL HIGH (ref 0–99)
Total CHOL/HDL Ratio: 5.3 RATIO
Triglycerides: 105 mg/dL (ref <150)
VLDL: 21 mg/dL (ref 0–40)

## 2015-01-06 LAB — HEMOGLOBIN A1C
Hgb A1c MFr Bld: 5.1 % (ref <5.7)
MEAN PLASMA GLUCOSE: 100 mg/dL (ref <117)

## 2015-01-06 LAB — MRSA PCR SCREENING: MRSA by PCR: POSITIVE — AB

## 2015-01-06 MED ORDER — ATORVASTATIN CALCIUM 10 MG PO TABS: 10.0000 mg | ORAL_TABLET | Freq: Every day | ORAL | Status: DC

## 2015-01-06 MED ORDER — OXYCODONE HCL 5 MG PO TABS
20.0000 mg | ORAL_TABLET | ORAL | Status: DC | PRN
Start: 2015-01-06 — End: 2015-01-06

## 2015-01-06 MED ORDER — ENOXAPARIN SODIUM 40 MG/0.4ML ~~LOC~~ SOLN: 40.0000 mg | SUBCUTANEOUS | Status: DC

## 2015-01-06 MED ORDER — OXYCODONE HCL ER 10 MG PO T12A
10.0000 mg | EXTENDED_RELEASE_TABLET | Freq: Two times a day (BID) | ORAL | Status: DC
  Administered 2015-01-06 (×2): 10 mg via ORAL
  Filled 2015-01-06 (×2): qty 1

## 2015-01-06 MED ORDER — ASPIRIN 325 MG PO TABS: 325.0000 mg | ORAL_TABLET | Freq: Every day | ORAL | Status: DC

## 2015-01-06 MED ORDER — OXYCODONE-ACETAMINOPHEN 5-325 MG PO TABS
1.0000 | ORAL_TABLET | Freq: Four times a day (QID) | ORAL | Status: DC | PRN
  Filled 2015-01-06: qty 1

## 2015-01-06 NOTE — Progress Notes (Signed)
Phoned the MD to get pain medicine per pt's request. MD ordered percocet. Pt refused percocet stating it would not be strong enough to control his pain. Notified MD, and awaiting response.

## 2015-01-06 NOTE — Progress Notes (Signed)
Nutrition Brief Note  Patient identified on the Malnutrition Screening Tool (MST) Report  Wt Readings from Last 15 Encounters:  01/05/15 197 lb (89.359 kg)  01/01/15 196 lb (88.905 kg)  07/17/14 212 lb (96.163 kg)  03/04/14 205 lb (92.987 kg)  07/03/13 212 lb (96.163 kg)  03/05/13 226 lb (102.513 kg)  02/17/13 207 lb 4 oz (94.008 kg)  01/26/13 211 lb (95.709 kg)  01/22/13 210 lb (95.255 kg)    Body mass index is 27.49 kg/(m^2). Patient meets criteria for overweight based on current BMI. Weight range 197-210#. No significant changes noted.  Current diet order is NPO pending SLP evaluation. Labs and medications reviewed.   No nutrition interventions warranted at this time. If nutrition issues arise, please consult RD.   Royann ShiversLynn Ceci Taliaferro MS,RD,CSG,LDN Office: [phone] Pager: [phone]

## 2015-01-06 NOTE — Progress Notes (Signed)
Pt states that he is having such severe back pain that he is unable to participate in PT evaluation.  If he is still here tomorrow I will try again then.

## 2015-01-06 NOTE — Care Management Utilization Note (Signed)
UR completed 

## 2015-01-06 NOTE — Progress Notes (Signed)
Educated pt on NPO diet per MD's order. Pt states he drank a [employer]milkshake from [employer]. Pt is refusing to follow NPO diet.

## 2015-01-06 NOTE — Discharge Summary (Signed)
Physician Discharge Summary  Danny RaiderFreddie L Shea EAV:[id number] DOB: 11/01/1966 DOA: 01/05/2015  PCP: Kendell BaneBELL, WILLIAM, MD  Admit date: 01/05/2015 Discharge date: 01/06/2015  Time spent: 20 minutes  Recommendations for Outpatient Follow-up:  1. Follow up with Neurology in 6 weeks  Discharge Diagnoses:  Active Problems:   CVA (cerebral infarction)   Discharge Condition: Stable  Diet recommendation: Heart healthy  Filed Weights   01/05/15 1934  Weight: 89.359 kg (197 lb)    History of present illness:  Please see admit h and p from 1/20 for details. Briefly, pt presents with L sided weakness over the past 3-4 weeks with slurred speech. The patient was admitted for stroke work up.  Hospital Course:  1. CVA - CT head unremarkable - Carotid dopplers unremarkable for stenosis - MRI with small questionable area of restricted diffusion in the central midbrain - Discussed case with Neurology who recommends continued high dose aspirin and follow up with Neurology as an outpatient in 6 weeks - PT/OT were consulted, thus far from therapy recs, no needs 2. HTN - BP remained stable 3. Chronic pain - Would resume chronic narcotics as per home regimen 4. HLD - LDL of 126 - Started lipitor 5. DVT prophylaxis - Cont on heparin subq while inpatient  Consultations:  Discussed with Neurology over phone  Discharge Exam: Filed Vitals:   01/06/15 0055 01/06/15 0255 01/06/15 0629 01/06/15 1100  BP: 166/99 179/91 148/65 176/90  Pulse: 70 78 66 75  Temp: 99 F (37.2 C) 97.8 F (36.6 C) 98.4 F (36.9 C) 99.9 F (37.7 C)  TempSrc: Oral Oral Oral Temporal  Resp:  18 20 20   Height:      Weight:      SpO2: 99% 99% 95% 95%    General: awake, in nad Cardiovascular: regular, s1, s2 Respiratory: normal resp effort, no wheezing  Discharge Instructions     Medication List    STOP taking these medications        aspirin EC 81 MG tablet  Replaced by:  aspirin 325 MG tablet      HYDROcodone-acetaminophen 7.5-325 MG per tablet  Commonly known as:  NORCO      TAKE these medications        ALPRAZolam 0.5 MG tablet  Commonly known as:  XANAX  Take 0.5 mg by mouth 3 (three) times daily as needed.     amLODipine 10 MG tablet  Commonly known as:  NORVASC  Take 10 mg by mouth daily.     aspirin 325 MG tablet  Take 1 tablet (325 mg total) by mouth daily.     atorvastatin 10 MG tablet  Commonly known as:  LIPITOR  Take 1 tablet (10 mg total) by mouth daily.     cloNIDine 0.1 MG tablet  Commonly known as:  CATAPRES  Take 0.1 mg by mouth 3 (three) times daily.     diphenhydrAMINE 50 MG tablet  Commonly known as:  BENADRYL  Take 50 mg by mouth at bedtime.     furosemide 20 MG tablet  Commonly known as:  LASIX  Take 20 mg by mouth 2 (two) times daily as needed for fluid.     labetalol 100 MG tablet  Commonly known as:  NORMODYNE  Take 100 mg by mouth 2 (two) times daily.     losartan 100 MG tablet  Commonly known as:  COZAAR  Take 100 mg by mouth daily.     meloxicam 15 MG tablet  Commonly known as:  MOBIC  Take 15 mg by mouth daily.     nitroGLYCERIN 0.4 MG SL tablet  Commonly known as:  NITROSTAT  Place 0.4 mg under the tongue every 5 (five) minutes as needed.     omeprazole 40 MG capsule  Commonly known as:  PRILOSEC  Take 40 mg by mouth daily.     OxyCODONE 10 mg T12a 12 hr tablet  Commonly known as:  OXYCONTIN  Take 10 mg by mouth every 12 (twelve) hours.     Oxycodone HCl 20 MG Tabs  Take 20 mg by mouth every 4 (four) hours as needed (for pain).     promethazine 25 MG tablet  Commonly known as:  PHENERGAN  Take 25 mg by mouth every 8 (eight) hours as needed for nausea.     spironolactone 50 MG tablet  Commonly known as:  ALDACTONE  Take 50 mg by mouth 2 (two) times daily.       Allergies  Allergen Reactions  . Darvocet [Propoxyphene N-Acetaminophen] Other (See Comments)    Unknown reaction  . Feldene [Piroxicam] Other (See  Comments)    Unknown reaction  . Penicillins Hives   Follow-up Information    Follow up with Kendell Bane, MD. Schedule an appointment as soon as possible for a visit in 1 week.   Specialty:  Family Medicine      Follow up with [facility], KOFI, MD In 6 weeks.   Specialty:  Neurology   Contact information:   [address] [place] [address] [phone]        The results of significant diagnostics from this hospitalization (including imaging, microbiology, ancillary and laboratory) are listed below for reference.    Significant Diagnostic Studies: Dg Chest 2 View  01/05/2015   CLINICAL DATA:  Generalized weakness and slurred speech for 1 week. History of hypertension, TIA as well as history of smoking and ventricular tachycardia.  EXAM: CHEST  2 VIEW  COMPARISON:  07/03/2013  FINDINGS: Cardiac silhouette normal size configuration. Normal mediastinal and hilar contours.  There is central vascular congestion and mild bilateral interstitial thickening which has developed since the prior study. No focal consolidation. No pleural effusion or pneumothorax. Lungs are mildly hyperexpanded.  Bony thorax is demineralized but intact.  IMPRESSION: Mild vascular congestion and interstitial thickening. Findings suggest mild interstitial pulmonary edema. No evidence of pneumonia.   Electronically Signed   By: Amie Portland M.D.   On: 01/05/2015 22:19   Ct Head Wo Contrast  01/05/2015   CLINICAL DATA:  Dysarthria and dizziness with visual disturbance and left-sided weakness  EXAM: CT HEAD WITHOUT CONTRAST  TECHNIQUE: Contiguous axial images were obtained from the base of the skull through the vertex without intravenous contrast.  COMPARISON:  January 01, 2015  FINDINGS: The ventricles are normal in size and configuration. There is no mass, hemorrhage, extra-axial fluid collection, or midline shift. The gray-white compartments are normal. No acute infarct is apparent. Bony calvarium appears intact.  The mastoid air cells are clear. There is rightward deviation of the nasal septum, stable.  IMPRESSION: No intracranial mass, hemorrhage, or focal gray -white compartment lesion/acute appearing infarct. No appreciable change compared to recent prior study.   Electronically Signed   By: Bretta Bang M.D.   On: 01/05/2015 20:41   Ct Head Wo Contrast  01/02/2015   CLINICAL DATA:  Slurred speech, dizziness and weakness beginning this afternoon.  EXAM: CT HEAD WITHOUT CONTRAST  TECHNIQUE: Contiguous axial images were obtained from the base  of the skull through the vertex without intravenous contrast.  COMPARISON:  None.  FINDINGS: The ventricles and sulci are normal. No intraparenchymal hemorrhage, mass effect nor midline shift. No acute large vascular territory infarcts.  No abnormal extra-axial fluid collections. Basal cisterns are patent. Mild calcific atherosclerosis of the carotid siphons.  No skull fracture. The included ocular globes and orbital contents are non-suspicious. The mastoid aircells and included paranasal sinuses are well-aerated.  IMPRESSION: No acute intracranial process ; normal noncontrast CT of the head for age.   Electronically Signed   By: Awilda Metro   On: 01/02/2015 00:38   Mr Maxine Glenn Head Wo Contrast  01/06/2015   CLINICAL DATA:  49 year old male with slurred speech and weakness. Symptoms x1 week. Initial encounter.  EXAM: MRI HEAD WITHOUT CONTRAST  MRA HEAD WITHOUT CONTRAST  TECHNIQUE: Multiplanar, multiecho pulse sequences of the brain and surrounding structures were obtained without intravenous contrast. Angiographic images of the head were obtained using MRA technique without contrast.  COMPARISON:  Head CT without contrast 12/2014, 01/01/2015.  FINDINGS: MRI HEAD FINDINGS  Mildly heterogeneous diffusion signal in the central midbrain (series 100, image 18), not associated with T2 or FLAIR hyperintensity. Otherwise No restricted diffusion or evidence of acute infarction.  Major intracranial vascular flow voids are preserved.  No midline shift, mass effect, evidence of mass lesion, ventriculomegaly, extra-axial collection or acute intracranial hemorrhage. Cervicomedullary junction and pituitary are within normal limits. Negative visualized cervical spine. Normal for age gray and white matter signal  Mild paranasal sinus mucosal thickening and trace mastoid fluid. Mild adenoid hypertrophy. Visualized scalp soft tissues are within normal limits. Normal bone marrow signal. Visualized orbit soft tissues are within normal limits.  MRA HEAD FINDINGS  Antegrade flow in the posterior circulation. Normal PICA origins. Normal vertebrobasilar junction. No basilar stenosis. SCA and PCA origins are normal. Posterior communicating arteries are diminutive or absent. Bilateral PCA branches are within normal limits.  Antegrade flow in both ICA siphons. Ophthalmic artery origins are within normal limits. No siphon stenosis. Patent carotid termini. MCA and ACA origins are within normal limits. Anterior communicating artery and visualized bilateral ACA branches are within normal limits. Visualized bilateral MCA branches are within normal limits.  IMPRESSION: 1. Questionable small focus of restricted diffusion in the central midbrain, but ischemia at that location would probably cause visual symptoms above all others. In the absence of diplopia or other visual symptoms I suspect artifact in the midbrain rather than genuine lacunar infarct. 2. Otherwise normal for age MRI appearance of the brain. 3.  Negative intracranial MRA.   Electronically Signed   By: Augusto Gamble M.D.   On: 01/06/2015 09:12   Mr Brain Wo Contrast  01/06/2015   CLINICAL DATA:  49 year old male with slurred speech and weakness. Symptoms x1 week. Initial encounter.  EXAM: MRI HEAD WITHOUT CONTRAST  MRA HEAD WITHOUT CONTRAST  TECHNIQUE: Multiplanar, multiecho pulse sequences of the brain and surrounding structures were obtained without  intravenous contrast. Angiographic images of the head were obtained using MRA technique without contrast.  COMPARISON:  Head CT without contrast 12/2014, 01/01/2015.  FINDINGS: MRI HEAD FINDINGS  Mildly heterogeneous diffusion signal in the central midbrain (series 100, image 18), not associated with T2 or FLAIR hyperintensity. Otherwise No restricted diffusion or evidence of acute infarction. Major intracranial vascular flow voids are preserved.  No midline shift, mass effect, evidence of mass lesion, ventriculomegaly, extra-axial collection or acute intracranial hemorrhage. Cervicomedullary junction and pituitary are within normal limits. Negative visualized cervical  spine. Normal for age gray and white matter signal  Mild paranasal sinus mucosal thickening and trace mastoid fluid. Mild adenoid hypertrophy. Visualized scalp soft tissues are within normal limits. Normal bone marrow signal. Visualized orbit soft tissues are within normal limits.  MRA HEAD FINDINGS  Antegrade flow in the posterior circulation. Normal PICA origins. Normal vertebrobasilar junction. No basilar stenosis. SCA and PCA origins are normal. Posterior communicating arteries are diminutive or absent. Bilateral PCA branches are within normal limits.  Antegrade flow in both ICA siphons. Ophthalmic artery origins are within normal limits. No siphon stenosis. Patent carotid termini. MCA and ACA origins are within normal limits. Anterior communicating artery and visualized bilateral ACA branches are within normal limits. Visualized bilateral MCA branches are within normal limits.  IMPRESSION: 1. Questionable small focus of restricted diffusion in the central midbrain, but ischemia at that location would probably cause visual symptoms above all others. In the absence of diplopia or other visual symptoms I suspect artifact in the midbrain rather than genuine lacunar infarct. 2. Otherwise normal for age MRI appearance of the brain. 3.  Negative  intracranial MRA.   Electronically Signed   By: Augusto Gamble M.D.   On: 01/06/2015 09:12   US Carotid Bilateral  01/06/2015   CLINICAL DATA:  Stroke. Coronary artery disease, previous tobacco abuse.  EXAM: BILATERAL CAROTID DUPLEX ULTRASOUND  TECHNIQUE: Wallace Cullens scale imaging, color Doppler and duplex ultrasound was performed of bilateral carotid and vertebral arteries in the neck.  COMPARISON:  None.  REVIEW OF SYSTEMS: Quantification of carotid stenosis is based on velocity parameters that correlate the residual internal carotid diameter with NASCET-based stenosis levels, using the diameter of the distal internal carotid lumen as the denominator for stenosis measurement.  The following velocity measurements were obtained:  PEAK SYSTOLIC/END DIASTOLIC  RIGHT  ICA:                     100/32cm/sec  CCA:                     77/16cm/sec  SYSTOLIC ICA/CCA RATIO:  1.3  DIASTOLIC ICA/CCA RATIO: 2.0  ECA:                     111cm/sec  LEFT  ICA:                     86/28cm/sec  CCA:                     8 2/18cm/sec  SYSTOLIC ICA/CCA RATIO:  1.1  DIASTOLIC ICA/CCA RATIO: 1.6  ECA:                     114cm/sec  FINDINGS: RIGHT CAROTID ARTERY: Mild, slightly irregular plaque through the common carotid artery and bulb extending into the proximal ICA without high-grade stenosis. Normal waveforms and color Doppler signal.  RIGHT VERTEBRAL ARTERY:  Normal flow direction and waveform.  LEFT CAROTID ARTERY: Smooth intimal thickening through the common carotid artery. Circumferential smooth plaque in the carotid bulb extending into the mild proximal ICA and external carotid artery with focal areas of calcification, resulting in at least mild stenosis. Normal waveforms and color Doppler signal.  LEFT VERTEBRAL ARTERY: Normal flow direction and waveform.  IMPRESSION: 1. Bilateral carotid bifurcation and proximal ICA plaque, resulting in less than 50% diameter stenosis. The exam does not exclude plaque ulceration or embolization.  Continued surveillance recommended.  Electronically Signed   By: Oley Balm M.D.   On: 01/06/2015 09:32    Microbiology: No results found for this or any previous visit (from the past 240 hour(s)).   Labs: Basic Metabolic Panel:  Recent Labs Lab 01/01/15 2330 01/05/15 2003  NA 140 141  K 3.4* 3.8  CL 107 106  CO2 27 29  GLUCOSE 92 98  BUN 14 11  CREATININE 0.99 1.02  CALCIUM 8.7 9.3   Liver Function Tests:  Recent Labs Lab 01/01/15 2330  AST 17  ALT 16  ALKPHOS 70  BILITOT 0.3  PROT 5.9*  ALBUMIN 3.7   No results for input(s): LIPASE, AMYLASE in the last 168 hours. No results for input(s): AMMONIA in the last 168 hours. CBC:  Recent Labs Lab 01/01/15 2330 01/05/15 2003  WBC 8.5 6.9  NEUTROABS 6.2 4.8  HGB 13.2 13.2  HCT 38.9* 39.2  MCV 91.7 91.6  PLT 242 246   Cardiac Enzymes:  Recent Labs Lab 01/01/15 2330 01/05/15 2003  TROPONINI <0.03 <0.03   BNP: BNP (last 3 results) No results for input(s): PROBNP in the last 8760 hours. CBG: No results for input(s): GLUCAP in the last 168 hours.   Signed:  CHIU, Scheryl Marten  [facility] 01/06/2015, 12:13 PM

## 2015-01-06 NOTE — Progress Notes (Signed)
OT Cancellation Note  Patient Details Name: Danny Shea MRN: [id number] DOB: 09/03/1966   Cancelled Treatment:    Reason Eval/Treat Not Completed: OT screened, no needs identified, will sign off.  Patient reports that he is mostly back to baseline. UB strength assessed. RUE shoulder flexion and horizontal abduction: 5/5. Elbow extension/flexion 4-/5. Functional gross grasp. AROM LUE WNL in all ranges. [facility]. No vision changes. Pt has no concerns about returning home.   Limmie PatriciaLaura Essenmacher, OTR/L,CBIS  [phone]  01/06/2015, 10:44 AM

## 2015-01-07 LAB — URINE CULTURE
Colony Count: NO GROWTH
Culture: NO GROWTH

## 2015-01-07 NOTE — Care Management Utilization Note (Signed)
UR completed 

## 2015-02-01 ENCOUNTER — Encounter: Payer: Self-pay | Admitting: Physical Medicine & Rehabilitation

## 2015-02-14 ENCOUNTER — Ambulatory Visit: Payer: Managed Care, Other (non HMO) | Admitting: Physical Medicine & Rehabilitation

## 2015-02-21 ENCOUNTER — Ambulatory Visit (INDEPENDENT_AMBULATORY_CARE_PROVIDER_SITE_OTHER): Payer: Managed Care, Other (non HMO) | Admitting: Neurology

## 2015-02-21 ENCOUNTER — Telehealth: Payer: Self-pay | Admitting: Neurology

## 2015-02-21 ENCOUNTER — Encounter: Payer: Self-pay | Admitting: Neurology

## 2015-02-21 VITALS — BP 119/70 | HR 69 | Ht 71.0 in | Wt 196.0 lb

## 2015-02-21 DIAGNOSIS — F17200 Nicotine dependence, unspecified, uncomplicated: Secondary | ICD-10-CM

## 2015-02-21 DIAGNOSIS — I639 Cerebral infarction, unspecified: Secondary | ICD-10-CM

## 2015-02-21 DIAGNOSIS — I10 Essential (primary) hypertension: Secondary | ICD-10-CM | POA: Diagnosis not present

## 2015-02-21 DIAGNOSIS — E538 Deficiency of other specified B group vitamins: Secondary | ICD-10-CM | POA: Insufficient documentation

## 2015-02-21 DIAGNOSIS — R519 Headache, unspecified: Secondary | ICD-10-CM | POA: Insufficient documentation

## 2015-02-21 DIAGNOSIS — R51 Headache: Secondary | ICD-10-CM

## 2015-02-21 DIAGNOSIS — G4730 Sleep apnea, unspecified: Secondary | ICD-10-CM | POA: Diagnosis not present

## 2015-02-21 DIAGNOSIS — E663 Overweight: Secondary | ICD-10-CM

## 2015-02-21 DIAGNOSIS — G441 Vascular headache, not elsewhere classified: Secondary | ICD-10-CM | POA: Diagnosis not present

## 2015-02-21 DIAGNOSIS — R413 Other amnesia: Secondary | ICD-10-CM | POA: Insufficient documentation

## 2015-02-21 DIAGNOSIS — G4733 Obstructive sleep apnea (adult) (pediatric): Secondary | ICD-10-CM

## 2015-02-21 NOTE — Progress Notes (Signed)
Reason for visit: Stroke  Danny Shea is a 49 y.o. male  History of present illness:  Danny Shea is a 49 year old right-handed white male with a history of atrial fibrillation, status post ablation therapy in the past. The patient has significant hypertension that has been uncontrolled until recently. He began feeling poorly in the middle of January 2016. He began having headaches, he noted that the blood pressure was significantly elevated with systolic pressures in the 190s, diastolic in the 130s. The patient had been on a low-dose aspirin. The patient went to the emergency room around 01/01/2015, and a CT scan of the head was done. The patient left AGAINST MEDICAL ADVICE. He was confused around that time, had poor memory, but no focal symptoms. The patient returned around the 20th of January, 2016 with onset of slurring of speech, left-sided numbness, blurred vision. The patient underwent MRI evaluation of the brain that questioned a right midbrain acute stroke. MRA of the head was unremarkable. Carotid Doppler studies were relatively unremarkable. The patient was increased on aspirin taking 325 mg daily, and blood pressure management was altered, clonidine was added. This has normalized his blood pressures. His headaches have improved, but still occur on occasion. The numbness on the left side has gone away. The patient is sent to this office for an evaluation. He still continues to have some memory issues. The patient is on a medications for chronic low back pain. His wife indicates that she has observed episodes of sleep apnea at nighttime. The patient has undergone an overnight study that reveals significant oxygen desaturations, he was told that he needed a formal sleep study. The patient has a history of vitamin B12 deficiency, but he has not been on any B12 shots in over 2 years.  Past Medical History  Diagnosis Date  . Hypertension   . Hyperlipidemia   . Chest pain     Multiple  episodes of chest pain; cath 11/2008: No CAD, normal EF  . Ventricular tachycardia     radiofrequency ablation in 12/2008; H/o myocardial infarction, but no coronary artery disease on coronary angiography or wall motion abnormality on echo  . Syncope     recurrent resulting in hospitalization in 01/2013; hypotensive with acute renal failure, likely medication induced  . Tobacco abuse     45 pack years; 2013-consumption reduced to 0.5 pack per day  . Degenerative joint disease     Lumbosacral spine; chronic low back pain  . MRSA (methicillin resistant Staphylococcus aureus) infection   . Depression   . TIA (transient ischemic attack)   . Chronic back pain   . Atrial fibrillation   . Sleep apnea 02/21/2015  . Vitamin B12 deficiency 02/21/2015    Past Surgical History  Procedure Laterality Date  . Radiofrequency ablation      Ventricular tachycardia    Family History  Problem Relation Age of Onset  . Heart attack Mother     Coronary artery disease, prior PCI  . Heart failure Mother   . Heart attack Brother     Prior PCI & CHF;  also cousin prior to the age of 34  . Renal Disease Brother   . Cancer Father     Also mother  . Asthma Sister     Social history:  reports that he has been smoking Cigarettes.  He has been smoking about 0.50 packs per day. He has never used smokeless tobacco. He reports that he does not drink alcohol or use  illicit drugs.  Medications:  Prior to Admission medications   Medication Sig Start Date End Date Taking? Authorizing Provider  ALPRAZolam Prudy Feeler(XANAX) 0.5 MG tablet Take 0.5 mg by mouth 3 (three) times daily as needed.  12/08/12  Yes Historical Provider, MD  amLODipine (NORVASC) 10 MG tablet Take 10 mg by mouth daily.   Yes Historical Provider, MD  aspirin 325 MG tablet Take 1 tablet (325 mg total) by mouth daily. 01/06/15  Yes Jerald KiefStephen K Chiu, MD  cloNIDine (CATAPRES) 0.1 MG tablet Take 0.1 mg by mouth 3 (three) times daily.   Yes Historical Provider, MD    diphenhydrAMINE (BENADRYL) 50 MG tablet Take 50 mg by mouth at bedtime.   Yes Historical Provider, MD  furosemide (LASIX) 20 MG tablet Take 20 mg by mouth 2 (two) times daily as needed for fluid.   Yes Historical Provider, MD  labetalol (NORMODYNE) 100 MG tablet Take 100 mg by mouth 2 (two) times daily.   Yes Historical Provider, MD  losartan (COZAAR) 100 MG tablet Take 100 mg by mouth daily.  02/03/13  Yes Historical Provider, MD  meloxicam (MOBIC) 15 MG tablet Take 15 mg by mouth daily. 12/31/14  Yes Historical Provider, MD  nitroGLYCERIN (NITROSTAT) 0.4 MG SL tablet Place 0.4 mg under the tongue every 5 (five) minutes as needed.   Yes Historical Provider, MD  omeprazole (PRILOSEC) 40 MG capsule Take 40 mg by mouth daily.   Yes Historical Provider, MD  OxyCODONE (OXYCONTIN) 10 mg T12A 12 hr tablet Take 10 mg by mouth every 12 (twelve) hours.   Yes Historical Provider, MD  Oxycodone HCl 20 MG TABS Take 20 mg by mouth every 4 (four) hours as needed (for pain).   Yes Historical Provider, MD  promethazine (PHENERGAN) 25 MG tablet Take 25 mg by mouth every 8 (eight) hours as needed for nausea.    Yes Historical Provider, MD  spironolactone (ALDACTONE) 50 MG tablet Take 50 mg by mouth 2 (two) times daily.   Yes Historical Provider, MD      Allergies  Allergen Reactions  . Darvocet [Propoxyphene N-Acetaminophen] Other (See Comments)    Unknown reaction  . Feldene [Piroxicam] Other (See Comments)    Unknown reaction  . Penicillins Hives    ROS:  Out of a complete 14 system review of symptoms, the patient complains only of the following symptoms, and all other reviewed systems are negative.  Weight loss, fatigue Hearing loss, spinning sensation Birthmark Blurred vision, loss of vision Shortness of breath, cough, wheezing Impotence Increased thirst Joint pain, muscle cramps, aching muscles Runny nose Memory loss, confusion, headache, numbness, weakness, slurred speech, dizziness,  tremor Depression, anxiety, decreased energy, change in appetite, disinterest in activities, racing thoughts Sleepiness, snoring, restless legs  Blood pressure 119/70, pulse 69, height 5\' 11"  (1.803 m), weight 196 lb (88.905 kg).  Physical Exam  General: The patient is alert and cooperative at the time of the examination.  Eyes: Pupils are equal, round, and reactive to light. Discs are flat bilaterally.  Neck: The neck is supple, no carotid bruits are noted.  Respiratory: The respiratory examination is clear.  Cardiovascular: The cardiovascular examination reveals a regular rate and rhythm, no obvious murmurs or rubs are noted.  Abdomen: Nontender, positive bowel sounds.  Skin: Extremities are without significant edema.  Neurologic Exam  Mental status: The Mini-Mental Status Examination done today shows a total score of 28/30.  Cranial nerves: Facial symmetry is present. There is good sensation of the face to  pinprick and soft touch bilaterally. The strength of the facial muscles and the muscles to head turning and shoulder shrug are normal bilaterally. Speech is well enunciated, no aphasia or dysarthria is noted. Extraocular movements are full. Visual fields are full. The tongue is midline, and the patient has symmetric elevation of the soft palate. No obvious hearing deficits are noted.  Motor: The motor testing reveals 5 over 5 strength of all 4 extremities. Good symmetric motor tone is noted throughout.  Sensory: Sensory testing is intact to pinprick, soft touch, vibration sensation, and position sense on all 4 extremities, with exception that there is a decrease in position sense on all 4 extremities. No evidence of extinction is noted.  Coordination: Cerebellar testing reveals good finger-nose-finger and heel-to-shin bilaterally.  Gait and station: Gait is normal. Tandem gait is normal. Romberg is negative. No drift is seen.  Reflexes: Deep tendon reflexes are symmetric and  normal bilaterally. Toes are downgoing bilaterally.   MRI brain and MRA head 01/06/15:  IMPRESSION: 1. Questionable small focus of restricted diffusion in the central midbrain, but ischemia at that location would probably cause visual symptoms above all others. In the absence of diplopia or other visual symptoms I suspect artifact in the midbrain rather than genuine lacunar infarct. 2. Otherwise normal for age MRI appearance of the brain. 3. Negative intracranial MRA.  *MRI images were reviewed online, agree with written report.   2D echo 01/23/13:  Study Conclusions  - Left ventricle: The cavity size was normal. There was mild tomoderate concentric hypertrophy. Systolic function was normal. The estimated ejection fraction was in the range of 55% to 60%. Wall motion was normal; there were no regional wall motion abnormalities. - Left atrium: The atrium was mildly to moderately dilated. - Right ventricle: The cavity size was mildly dilated. Wall thickness was normal. - Right atrium: The atrium was mildly to moderately dilated.   Carotid doppler 01/06/15:  IMPRESSION: 1. Bilateral carotid bifurcation and proximal ICA plaque, resulting in less than 50% diameter stenosis. The exam does not exclude plaque ulceration or embolization. Continued surveillance recommended.   Assessment/Plan:  1. Stroke event, midbrain  2. Hypertension, poorly controlled  3. Sleep apnea  4. Vitamin B12 deficiency  5. Reported memory disturbance  6. Atrial fibrillation, status post ablation therapy   7. Tobacco abuse  The patient has multiple risk factors for stroke including smoking, street of atrial fibrillation, uncontrolled hypertension, and sleep apnea. The patient has been counseled to stop smoking. Sleep apnea is an independent risk factor for stroke, and he will be set up for a sleep evaluation. The patient has observed apnea, and he has had a home study that suggests  significant oxygen desaturations at night consistent with sleep apnea. The patient will remain on aspirin, and monitor his blood pressure closely. The hypertension is the highest risk factor for stroke that this patient has. He will follow-up in about 4 months. Blood work will be done today to recheck the B12 levels, look for other etiologies of memory disorder.   [name] MD 02/21/2015 9:57 PM  [facility] [address] [place], [place] [address]  Phone [phone] Fax [phone]

## 2015-02-21 NOTE — Telephone Encounter (Signed)
Danny Acre, MD refers patient for attended sleep study.  Height: 5'11"  Weight: 196 lb  BMI: 27.35  Past Medical History:  Hypertension    . Hyperlipidemia   . Chest pain     Multiple episodes of chest pain; cath 11/2008: No CAD, normal EF  . Ventricular tachycardia     radiofrequency ablation in 12/2008; H/o myocardial infarction, but no coronary artery disease on coronary angiography or wall motion abnormality on echo  . Syncope     recurrent resulting in hospitalization in 01/2013; hypotensive with acute renal failure, likely medication induced  . Tobacco abuse     45 pack years; 2013-consumption reduced to 0.5 pack per day  . Degenerative joint disease     Lumbosacral spine; chronic low back pain  . MRSA (methicillin resistant Staphylococcus aureus) infection   . Depression   . TIA (transient ischemic attack)   . Chronic back pain   . Atrial fibrillation   . Sleep apnea 02/21/2015  . Vitamin B12 deficiency 02/21/2015           Sleep Symptoms: Fatigue, sleepiness, snoring, restless legs, sleep apnea   Epworth Score: Unable to reach the patient   Medication:  ALPRAZolam (Tab) XANAX 0.5 MG Take 0.5 mg by mouth 3 (three) times daily as needed.        AmLODIPine Besylate (Tab) NORVASC 10 MG Take 10 mg by mouth daily.      Aspirin (Tab) aspirin 325 MG Take 1 tablet (325 mg total) by mouth daily.      CloNIDine HCl (Tab) CATAPRES 0.1 MG Take 0.1 mg by mouth 3 (three) times daily.      DiphenhydrAMINE HCl (Tab) BENADRYL 50 MG Take 50 mg by mouth at bedtime.      Furosemide (Tab) LASIX 20 MG Take 20 mg by mouth 2 (two) times daily as needed for fluid.      Labetalol HCl (Tab) NORMODYNE 100 MG Take 100 mg by mouth 2 (two) times daily.      Losartan Potassium (Tab) COZAAR 100 MG Take 100 mg by mouth daily.       Meloxicam (Tab) MOBIC 15 MG Take 15 mg by mouth daily.      Nitroglycerin (SL Tab)  NITROSTAT 0.4 MG Place 0.4 mg under the tongue every 5 (five) minutes as needed.      Omeprazole (Capsule Delayed Release) PRILOSEC 40 MG Take 40 mg by mouth daily.      OxyCODONE HCl (Tablet Extended Release 12 hour Abuse-Deterrent) OXYCONTIN 10 mg Take 10 mg by mouth every 12 (twelve) hours.      OxyCODONE HCl (Tab) Oxycodone HCl 20 MG Take 20 mg by mouth every 4 (four) hours as needed (for pain).      Promethazine HCl (Tab) PHENERGAN 25 MG Take 25 mg by mouth every 8 (eight) hours as needed for nausea.       Spironolactone (Tab) ALDACTONE 50 MG Take 50 mg by mouth 2 (two) times daily.      .         Ins: [employer]   Assessment & Plan:  1. Stroke event, midbrain  2. Hypertension, poorly controlled  3. Sleep apnea  4. Vitamin B12 deficiency  5. Reported memory disturbance  6. Atrial fibrillation, status post ablation therapy   7. Tobacco abuse  The patient has multiple risk factors for stroke including smoking, street of atrial fibrillation, uncontrolled retention, and sleep apnea. The patient has been counseled to stop smoking. Sleep apnea  is an independent risk factor for stroke, and he will be set up for a sleep evaluation. The patient has observed apnea, and he has had a home study that suggests significant oxygen desaturations at night consistent with sleep apnea. The patient will remain on aspirin, and monitor his blood pressure closely. The hypertension is the highest risk factor for stroke that this patient has. He will follow-up in about 4 months. Blood work will be done today to recheck the B12 levels, look for other etiologies of memory disorder.  [name]. Keith Willis MD 02/21/2015 9:28 AM  Please review patient information and submit instructions for scheduling and orders for sleep technologist. Thank you.

## 2015-02-21 NOTE — Patient Instructions (Signed)
Smoking Cessation Quitting smoking is important to your health and has many advantages. However, it is not always easy to quit since nicotine is a very addictive drug. Oftentimes, people try 3 times or more before being able to quit. This document explains the best ways for you to prepare to quit smoking. Quitting takes hard work and a lot of effort, but you can do it. ADVANTAGES OF QUITTING SMOKING  You will live longer, feel better, and live better.  Your body will feel the impact of quitting smoking almost immediately.  Within 20 minutes, blood pressure decreases. Your pulse returns to its normal level.  After 8 hours, carbon monoxide levels in the blood return to normal. Your oxygen level increases.  After 24 hours, the chance of having a heart attack starts to decrease. Your breath, hair, and body stop smelling like smoke.  After 48 hours, damaged nerve endings begin to recover. Your sense of taste and smell improve.  After 72 hours, the body is virtually free of nicotine. Your bronchial tubes relax and breathing becomes easier.  After 2 to 12 weeks, lungs can hold more air. Exercise becomes easier and circulation improves.  The risk of having a heart attack, stroke, cancer, or lung disease is greatly reduced.  After 1 year, the risk of coronary heart disease is cut in half.  After 5 years, the risk of stroke falls to the same as a nonsmoker.  After 10 years, the risk of lung cancer is cut in half and the risk of other cancers decreases significantly.  After 15 years, the risk of coronary heart disease drops, usually to the level of a nonsmoker.  If you are pregnant, quitting smoking will improve your chances of having a healthy baby.  The people you live with, especially any children, will be healthier.  You will have extra money to spend on things other than cigarettes. QUESTIONS TO THINK ABOUT BEFORE ATTEMPTING TO QUIT You may want to talk about your answers with your  health care provider.  Why do you want to quit?  If you tried to quit in the past, what helped and what did not?  What will be the most difficult situations for you after you quit? How will you plan to handle them?  Who can help you through the tough times? Your family? Friends? A health care provider?  What pleasures do you get from smoking? What ways can you still get pleasure if you quit? Here are some questions to ask your health care provider:  How can you help me to be successful at quitting?  What medicine do you think would be best for me and how should I take it?  What should I do if I need more help?  What is smoking withdrawal like? How can I get information on withdrawal? GET READY  Set a quit date.  Change your environment by getting rid of all cigarettes, ashtrays, matches, and lighters in your home, car, or work. Do not let people smoke in your home.  Review your past attempts to quit. Think about what worked and what did not. GET SUPPORT AND ENCOURAGEMENT You have a better chance of being successful if you have help. You can get support in many ways.  Tell your family, friends, and coworkers that you are going to quit and need their support. Ask them not to smoke around you.  Get individual, group, or telephone counseling and support. Programs are available at local hospitals and health centers. Call   your local health department for information about programs in your area.  Spiritual beliefs and practices may help some smokers quit.  Download a "quit meter" on your computer to keep track of quit statistics, such as how long you have gone without smoking, cigarettes not smoked, and money saved.  Get a self-help book about quitting smoking and staying off tobacco. LEARN NEW SKILLS AND BEHAVIORS  Distract yourself from urges to smoke. Talk to someone, go for a walk, or occupy your time with a task.  Change your normal routine. Take a different route to work.  Drink tea instead of coffee. Eat breakfast in a different place.  Reduce your stress. Take a hot bath, exercise, or read a book.  Plan something enjoyable to do every day. Reward yourself for not smoking.  Explore interactive web-based programs that specialize in helping you quit. GET MEDICINE AND USE IT CORRECTLY Medicines can help you stop smoking and decrease the urge to smoke. Combining medicine with the above behavioral methods and support can greatly increase your chances of successfully quitting smoking.  Nicotine replacement therapy helps deliver nicotine to your body without the negative effects and risks of smoking. Nicotine replacement therapy includes nicotine gum, lozenges, inhalers, nasal sprays, and skin patches. Some may be available over-the-counter and others require a prescription.  Antidepressant medicine helps people abstain from smoking, but how this works is unknown. This medicine is available by prescription.  Nicotinic receptor partial agonist medicine simulates the effect of nicotine in your brain. This medicine is available by prescription. Ask your health care provider for advice about which medicines to use and how to use them based on your health history. Your health care provider will tell you what side effects to look out for if you choose to be on a medicine or therapy. Carefully read the information on the package. Do not use any other product containing nicotine while using a nicotine replacement product.  RELAPSE OR DIFFICULT SITUATIONS Most relapses occur within the first 3 months after quitting. Do not be discouraged if you start smoking again. Remember, most people try several times before finally quitting. You may have symptoms of withdrawal because your body is used to nicotine. You may crave cigarettes, be irritable, feel very hungry, cough often, get headaches, or have difficulty concentrating. The withdrawal symptoms are only temporary. They are strongest  when you first quit, but they will go away within 10-14 days. To reduce the chances of relapse, try to:  Avoid drinking alcohol. Drinking lowers your chances of successfully quitting.  Reduce the amount of caffeine you consume. Once you quit smoking, the amount of caffeine in your body increases and can give you symptoms, such as a rapid heartbeat, sweating, and anxiety.  Avoid smokers because they can make you want to smoke.  Do not let weight gain distract you. Many smokers will gain weight when they quit, usually less than 10 pounds. Eat a healthy diet and stay active. You can always lose the weight gained after you quit.  Find ways to improve your mood other than smoking. FOR MORE INFORMATION  www.smokefree.gov  Document Released: 11/27/2001 Document Revised: 04/19/2014 Document Reviewed: 03/13/2012 Exit[employer] Patient Information 2015 [employer]. This information is not intended to replace advice given to you by your health care provider. Make sure you discuss any questions you have with your health care provider.  

## 2015-02-22 NOTE — Telephone Encounter (Signed)
In House Sleep study request review: This patient has an underlying medical history of midbrain stroke, smoking, ventricular tachycardia, overweight state, and is referred by Dr. Anne HahnWillis for an attended sleep study due to a report of a prior diagnosis of obstructive sleep apnea via home sleep test. I will order a split-night sleep study and see the patient in sleep medicine consultation afterwards as appropriate. Please print this note and attach to sleep study chart/package.   Sleep Acquisition Technologist instructions: Please score at 3% and split if 2 hour estimated AHI >15/h, unless mandated otherwise by the insurance carrier, [employer]   Huston FoleySaima Marvetta Vohs, MD, PhD [facility] ([facility])

## 2015-02-23 ENCOUNTER — Telehealth: Payer: Self-pay | Admitting: Neurology

## 2015-02-23 LAB — HIV ANTIBODY (ROUTINE TESTING W REFLEX): HIV Screen 4th Generation wRfx: NONREACTIVE

## 2015-02-23 LAB — VITAMIN B12: VITAMIN B 12: 217 pg/mL (ref 211–946)

## 2015-02-23 LAB — VITAMIN B1: Thiamine: 148.7 nmol/L (ref 66.5–200.0)

## 2015-02-23 LAB — RPR: RPR Ser Ql: NONREACTIVE

## 2015-02-23 LAB — METHYLMALONIC ACID, SERUM: Methylmalonic Acid: 474 nmol/L — ABNORMAL HIGH (ref 0–378)

## 2015-02-23 NOTE — Telephone Encounter (Signed)
I called patient. The vitamin B12 level was in the low normal range, the methylmalonic acid level was elevated. The patient is to go on 1000 g tablets of vitamin B12. Otherwise, the blood work was unremarkable.

## 2015-04-06 ENCOUNTER — Ambulatory Visit (INDEPENDENT_AMBULATORY_CARE_PROVIDER_SITE_OTHER): Payer: Managed Care, Other (non HMO) | Admitting: Neurology

## 2015-04-06 VITALS — BP 144/81 | HR 74

## 2015-04-06 DIAGNOSIS — G479 Sleep disorder, unspecified: Secondary | ICD-10-CM

## 2015-04-06 DIAGNOSIS — G4733 Obstructive sleep apnea (adult) (pediatric): Secondary | ICD-10-CM

## 2015-04-06 DIAGNOSIS — G4710 Hypersomnia, unspecified: Secondary | ICD-10-CM

## 2015-04-06 DIAGNOSIS — G4730 Sleep apnea, unspecified: Secondary | ICD-10-CM

## 2015-04-06 NOTE — Sleep Study (Signed)
Please see the scanned sleep study interpretation located in the Procedure tab within the Chart Review section. 

## 2015-04-14 ENCOUNTER — Telehealth: Payer: Self-pay | Admitting: Neurology

## 2015-04-14 DIAGNOSIS — G4733 Obstructive sleep apnea (adult) (pediatric): Secondary | ICD-10-CM

## 2015-04-14 NOTE — Telephone Encounter (Signed)
Diana:   Dr. Anne HahnWillis patient:   Danny Shea: Please call and notify patient that the recent sleep study confirmed the diagnosis of OSA. He did well with CPAP during the study with improvement of the respiratory events. Therefore, I would like start the patient on CPAP at home. I placed the order in the chart. The patient will need a follow up appointment with me in 8 to 10 weeks post set up that has to be scheduled; please go ahead and schedule while you have the patient on the phone and make sure patient understands the importance of keeping this window for the FU appointment, as it is often an insurance requirement and failing to adhere to this may result in losing coverage for sleep apnea treatment. 15 min follow-up should suffice, unless there is a 30 min FU slot available.  Please re-enforce the importance of compliance with treatment and the need for us to monitor compliance data - again an insurance requirement.  Also remind patient, that any upcoming CPAP machine or mask issues, should be first addressed with the DME company. Please ask if patient has a preference regarding DME company and arrange for CPAP set up at home through a DME company of patient's choice - once you have spoken to patient - and faxed/routed report to PCP and referring MD (if other than PCP), please  close this encounter, thanks,   Huston FoleySaima Quaran Kedzierski, MD, PhD [facility] ([facility]

## 2015-04-18 NOTE — Telephone Encounter (Signed)
I spoke to patient and he is aware of sleep study results. He would also like to proceed with CPAP and requested to use [facility]. I will fax the orders to them. Patient was not able to make f/u appt today but will call back to make appt.

## 2015-04-20 NOTE — Telephone Encounter (Signed)
I spoke to [facility], and his insurance is out of network for them. I spoke with patient and he is aware. I will send order to [facility], since they have a store near his house.

## 2015-05-09 ENCOUNTER — Encounter: Payer: Self-pay | Admitting: Neurology

## 2015-06-07 ENCOUNTER — Encounter: Payer: Self-pay | Admitting: Neurology

## 2015-06-22 ENCOUNTER — Ambulatory Visit: Payer: Managed Care, Other (non HMO) | Admitting: Adult Health

## 2015-06-23 ENCOUNTER — Encounter: Payer: Self-pay | Admitting: Adult Health

## 2015-07-01 ENCOUNTER — Ambulatory Visit: Payer: Managed Care, Other (non HMO) | Admitting: Adult Health

## 2015-09-16 ENCOUNTER — Encounter (HOSPITAL_COMMUNITY): Payer: Self-pay | Admitting: Emergency Medicine

## 2015-09-16 ENCOUNTER — Emergency Department (HOSPITAL_COMMUNITY): Payer: Managed Care, Other (non HMO)

## 2015-09-16 ENCOUNTER — Encounter (HOSPITAL_COMMUNITY)
Admission: EM | Disposition: A | Discharge status: Home / Self Care | Payer: Self-pay | Source: Home / Self Care | Attending: Emergency Medicine

## 2015-09-16 ENCOUNTER — Other Ambulatory Visit: Payer: Self-pay | Admitting: Cardiology

## 2015-09-16 ENCOUNTER — Observation Stay (HOSPITAL_COMMUNITY)
Admission: EM | Admit: 2015-09-16 | Discharge: 2015-09-17 | Disposition: A | Discharge status: Home / Self Care | Payer: Managed Care, Other (non HMO) | Attending: Internal Medicine | Admitting: Internal Medicine

## 2015-09-16 DIAGNOSIS — R079 Chest pain, unspecified: Secondary | ICD-10-CM

## 2015-09-16 DIAGNOSIS — Z23 Encounter for immunization: Secondary | ICD-10-CM | POA: Insufficient documentation

## 2015-09-16 DIAGNOSIS — G8929 Other chronic pain: Secondary | ICD-10-CM | POA: Insufficient documentation

## 2015-09-16 DIAGNOSIS — I252 Old myocardial infarction: Secondary | ICD-10-CM | POA: Insufficient documentation

## 2015-09-16 DIAGNOSIS — F17210 Nicotine dependence, cigarettes, uncomplicated: Secondary | ICD-10-CM | POA: Insufficient documentation

## 2015-09-16 DIAGNOSIS — Z7982 Long term (current) use of aspirin: Secondary | ICD-10-CM | POA: Insufficient documentation

## 2015-09-16 DIAGNOSIS — Z79899 Other long term (current) drug therapy: Secondary | ICD-10-CM | POA: Insufficient documentation

## 2015-09-16 DIAGNOSIS — I2510 Atherosclerotic heart disease of native coronary artery without angina pectoris: Secondary | ICD-10-CM | POA: Insufficient documentation

## 2015-09-16 DIAGNOSIS — I509 Heart failure, unspecified: Secondary | ICD-10-CM | POA: Insufficient documentation

## 2015-09-16 DIAGNOSIS — E7800 Pure hypercholesterolemia, unspecified: Secondary | ICD-10-CM | POA: Insufficient documentation

## 2015-09-16 DIAGNOSIS — I4891 Unspecified atrial fibrillation: Secondary | ICD-10-CM | POA: Insufficient documentation

## 2015-09-16 DIAGNOSIS — I214 Non-ST elevation (NSTEMI) myocardial infarction: Principal | ICD-10-CM

## 2015-09-16 DIAGNOSIS — G4733 Obstructive sleep apnea (adult) (pediatric): Secondary | ICD-10-CM | POA: Insufficient documentation

## 2015-09-16 DIAGNOSIS — F329 Major depressive disorder, single episode, unspecified: Secondary | ICD-10-CM | POA: Insufficient documentation

## 2015-09-16 DIAGNOSIS — I249 Acute ischemic heart disease, unspecified: Secondary | ICD-10-CM | POA: Diagnosis present

## 2015-09-16 DIAGNOSIS — I110 Hypertensive heart disease with heart failure: Secondary | ICD-10-CM | POA: Insufficient documentation

## 2015-09-16 DIAGNOSIS — M549 Dorsalgia, unspecified: Secondary | ICD-10-CM | POA: Insufficient documentation

## 2015-09-16 DIAGNOSIS — Z79891 Long term (current) use of opiate analgesic: Secondary | ICD-10-CM | POA: Insufficient documentation

## 2015-09-16 DIAGNOSIS — Z791 Long term (current) use of non-steroidal anti-inflammatories (NSAID): Secondary | ICD-10-CM | POA: Insufficient documentation

## 2015-09-16 DIAGNOSIS — Z8673 Personal history of transient ischemic attack (TIA), and cerebral infarction without residual deficits: Secondary | ICD-10-CM | POA: Insufficient documentation

## 2015-09-16 HISTORY — PX: CARDIAC CATHETERIZATION: SHX172

## 2015-09-16 HISTORY — DX: Obstructive sleep apnea (adult) (pediatric): G47.33

## 2015-09-16 HISTORY — DX: Heart failure, unspecified: I50.9

## 2015-09-16 HISTORY — DX: Non-ST elevation (NSTEMI) myocardial infarction: I21.4

## 2015-09-16 HISTORY — DX: Unspecified kidney failure: N19

## 2015-09-16 HISTORY — DX: Chronic obstructive pulmonary disease, unspecified: J44.9

## 2015-09-16 HISTORY — DX: Hypertensive heart disease without heart failure: I11.9

## 2015-09-16 HISTORY — DX: Arthropathic psoriasis, unspecified: L40.50

## 2015-09-16 HISTORY — DX: Dependence on other enabling machines and devices: Z99.89

## 2015-09-16 HISTORY — DX: Anxiety disorder, unspecified: F41.9

## 2015-09-16 HISTORY — DX: Vitamin B12 deficiency anemia, unspecified: D51.9

## 2015-09-16 LAB — I-STAT TROPONIN, ED: TROPONIN I, POC: 1.53 ng/mL — AB (ref 0.00–0.08)

## 2015-09-16 LAB — COMPREHENSIVE METABOLIC PANEL
ALBUMIN: 4.1 g/dL (ref 3.5–5.0)
ALT: 14 U/L — ABNORMAL LOW (ref 17–63)
ANION GAP: 8 (ref 5–15)
AST: 38 U/L (ref 15–41)
Alkaline Phosphatase: 58 U/L (ref 38–126)
BUN: 20 mg/dL (ref 6–20)
CO2: 23 mmol/L (ref 22–32)
Calcium: 9.4 mg/dL (ref 8.9–10.3)
Chloride: 108 mmol/L (ref 101–111)
Creatinine, Ser: 1.14 mg/dL (ref 0.61–1.24)
GFR calc Af Amer: >60 mL/min (ref >60)
Glucose, Bld: 102 mg/dL — ABNORMAL HIGH (ref 65–99)
POTASSIUM: 3.6 mmol/L (ref 3.5–5.1)
Sodium: 139 mmol/L (ref 135–145)
TOTAL PROTEIN: 6.7 g/dL (ref 6.5–8.1)
Total Bilirubin: 0.7 mg/dL (ref 0.3–1.2)

## 2015-09-16 LAB — CBC WITH DIFFERENTIAL/PLATELET
BASOS PCT: 0 %
Basophils Absolute: 0 10*3/uL (ref 0.0–0.1)
EOS PCT: 1 %
Eosinophils Absolute: 0.2 10*3/uL (ref 0.0–0.7)
HCT: 40.9 % (ref 39.0–52.0)
Hemoglobin: 14.2 g/dL (ref 13.0–17.0)
Lymphocytes Relative: 7 %
Lymphs Abs: 1 10*3/uL (ref 0.7–4.0)
MCH: 30.9 pg (ref 26.0–34.0)
MCHC: 34.7 g/dL (ref 30.0–36.0)
MCV: 89.1 fL (ref 78.0–100.0)
MONO ABS: 1 10*3/uL (ref 0.1–1.0)
Monocytes Relative: 7 %
NEUTROS ABS: 12 10*3/uL — AB (ref 1.7–7.7)
Neutrophils Relative %: 85 %
Platelets: 246 10*3/uL (ref 150–400)
RBC: 4.59 MIL/uL (ref 4.22–5.81)
RDW: 13.7 % (ref 11.5–15.5)
WBC: 14.3 10*3/uL — ABNORMAL HIGH (ref 4.0–10.5)

## 2015-09-16 LAB — POCT ACTIVATED CLOTTING TIME
ACTIVATED CLOTTING TIME: 257 s
ACTIVATED CLOTTING TIME: 294 s

## 2015-09-16 LAB — TROPONIN I
TROPONIN I: 7.99 ng/mL — AB (ref <0.031)
TROPONIN I: 35.99 ng/mL — AB (ref <0.031)

## 2015-09-16 LAB — MRSA PCR SCREENING: MRSA BY PCR: POSITIVE — AB

## 2015-09-16 SURGERY — LEFT HEART CATH AND CORONARY ANGIOGRAPHY

## 2015-09-16 MED ORDER — TICAGRELOR 90 MG PO TABS
ORAL_TABLET | ORAL | Status: AC
  Filled 2015-09-16: qty 1

## 2015-09-16 MED ORDER — TICAGRELOR 90 MG PO TABS
90.0000 mg | ORAL_TABLET | Freq: Two times a day (BID) | ORAL | Status: DC
  Administered 2015-09-17 (×2): 90 mg via ORAL
  Filled 2015-09-16 (×2): qty 1

## 2015-09-16 MED ORDER — ASPIRIN 81 MG PO CHEW
CHEWABLE_TABLET | ORAL | Status: DC | PRN
  Administered 2015-09-16: 81 mg via ORAL

## 2015-09-16 MED ORDER — ASPIRIN EC 81 MG PO TBEC
81.0000 mg | DELAYED_RELEASE_TABLET | Freq: Every day | ORAL | Status: DC
  Administered 2015-09-17: 81 mg via ORAL
  Filled 2015-09-16: qty 1

## 2015-09-16 MED ORDER — SODIUM CHLORIDE 0.9 % IV SOLN: 250.0000 mL | INTRAVENOUS | Status: DC | PRN

## 2015-09-16 MED ORDER — NICOTINE 21 MG/24HR TD PT24
21.0000 mg | MEDICATED_PATCH | Freq: Every day | TRANSDERMAL | Status: DC
  Administered 2015-09-16 – 2015-09-17 (×2): 21 mg via TRANSDERMAL
  Filled 2015-09-16 (×2): qty 1

## 2015-09-16 MED ORDER — TICAGRELOR 90 MG PO TABS
ORAL_TABLET | ORAL | Status: DC | PRN
  Administered 2015-09-16: 180 mg via ORAL

## 2015-09-16 MED ORDER — SODIUM CHLORIDE 0.9 % WEIGHT BASED INFUSION
1.0000 mL/kg/h | INTRAVENOUS | Status: DC
Start: 2015-09-17 — End: 2015-09-16

## 2015-09-16 MED ORDER — VITAMIN B-12 1000 MCG PO TABS
1000.0000 ug | ORAL_TABLET | Freq: Every day | ORAL | Status: DC
  Administered 2015-09-17: 11:00:00 1000 ug via ORAL
  Filled 2015-09-16: qty 1

## 2015-09-16 MED ORDER — SODIUM CHLORIDE 0.9 % IJ SOLN: 3.0000 mL | Freq: Two times a day (BID) | INTRAMUSCULAR | Status: DC

## 2015-09-16 MED ORDER — MIDAZOLAM HCL 2 MG/2ML IJ SOLN
INTRAMUSCULAR | Status: AC
  Filled 2015-09-16: qty 4

## 2015-09-16 MED ORDER — DIPHENHYDRAMINE HCL 25 MG PO CAPS
50.0000 mg | ORAL_CAPSULE | Freq: Every day | ORAL | Status: DC
  Administered 2015-09-16: 50 mg via ORAL
  Filled 2015-09-16 (×2): qty 2

## 2015-09-16 MED ORDER — NITROGLYCERIN 1 MG/10 ML FOR IR/CATH LAB
INTRA_ARTERIAL | Status: DC | PRN
  Administered 2015-09-16: 17:00:00

## 2015-09-16 MED ORDER — ATORVASTATIN CALCIUM 80 MG PO TABS
80.0000 mg | ORAL_TABLET | Freq: Every day | ORAL | Status: DC
  Administered 2015-09-16: 80 mg via ORAL
  Filled 2015-09-16: qty 1

## 2015-09-16 MED ORDER — HEPARIN (PORCINE) IN NACL 2-0.9 UNIT/ML-% IJ SOLN
INTRAMUSCULAR | Status: DC | PRN
Start: 2015-09-16 — End: 2015-09-16
  Administered 2015-09-16: 16:00:00 via INTRA_ARTERIAL

## 2015-09-16 MED ORDER — CLONIDINE HCL 0.1 MG PO TABS
0.1000 mg | ORAL_TABLET | Freq: Three times a day (TID) | ORAL | Status: DC
  Administered 2015-09-16 – 2015-09-17 (×2): 0.1 mg via ORAL
  Filled 2015-09-16 (×4): qty 1

## 2015-09-16 MED ORDER — ANGIOPLASTY BOOK
Freq: Once | Status: AC
  Administered 2015-09-16: 20:00:00
  Filled 2015-09-16: qty 1

## 2015-09-16 MED ORDER — AMLODIPINE BESYLATE 10 MG PO TABS
10.0000 mg | ORAL_TABLET | Freq: Every day | ORAL | Status: DC
  Administered 2015-09-17: 11:00:00 10 mg via ORAL
  Filled 2015-09-16: qty 1

## 2015-09-16 MED ORDER — FENTANYL CITRATE (PF) 100 MCG/2ML IJ SOLN
INTRAMUSCULAR | Status: DC | PRN
  Administered 2015-09-16 (×2): 25 ug via INTRAVENOUS

## 2015-09-16 MED ORDER — HEPARIN (PORCINE) IN NACL 2-0.9 UNIT/ML-% IJ SOLN
INTRAMUSCULAR | Status: AC
  Filled 2015-09-16: qty 1000

## 2015-09-16 MED ORDER — SODIUM CHLORIDE 0.9 % IV SOLN: INTRAVENOUS | Status: AC

## 2015-09-16 MED ORDER — FENTANYL CITRATE (PF) 100 MCG/2ML IJ SOLN
INTRAMUSCULAR | Status: AC
  Filled 2015-09-16: qty 4

## 2015-09-16 MED ORDER — HEPARIN (PORCINE) IN NACL 100-0.45 UNIT/ML-% IJ SOLN
1100.0000 [IU]/h | INTRAMUSCULAR | Status: DC
  Administered 2015-09-16: 1000 [IU]/h via INTRAVENOUS
  Filled 2015-09-16: qty 250

## 2015-09-16 MED ORDER — SODIUM CHLORIDE 0.9 % IJ SOLN: 3.0000 mL | INTRAMUSCULAR | Status: DC | PRN

## 2015-09-16 MED ORDER — LORAZEPAM 2 MG/ML IJ SOLN
INTRAMUSCULAR | Status: AC
  Filled 2015-09-16: qty 1

## 2015-09-16 MED ORDER — HEPARIN SODIUM (PORCINE) 1000 UNIT/ML IJ SOLN
INTRAMUSCULAR | Status: AC
  Filled 2015-09-16: qty 1

## 2015-09-16 MED ORDER — ALPRAZOLAM 0.5 MG PO TABS
1.0000 mg | ORAL_TABLET | Freq: Three times a day (TID) | ORAL | Status: DC | PRN
  Administered 2015-09-16: 1 mg via ORAL
  Filled 2015-09-16: qty 2

## 2015-09-16 MED ORDER — LIDOCAINE HCL (PF) 1 % IJ SOLN
INTRAMUSCULAR | Status: AC
  Filled 2015-09-16: qty 30

## 2015-09-16 MED ORDER — LORAZEPAM 1 MG PO TABS: 1.0000 mg | ORAL_TABLET | Freq: Once | ORAL | Status: DC

## 2015-09-16 MED ORDER — INFLUENZA VAC SPLIT QUAD 0.5 ML IM SUSY
0.5000 mL | PREFILLED_SYRINGE | Freq: Once | INTRAMUSCULAR | Status: AC
  Administered 2015-09-17: 0.5 mL via INTRAMUSCULAR
  Filled 2015-09-16: qty 0.5

## 2015-09-16 MED ORDER — MORPHINE SULFATE (PF) 4 MG/ML IV SOLN
4.0000 mg | Freq: Once | INTRAVENOUS | Status: AC
Start: 2015-09-16 — End: 2015-09-16
  Administered 2015-09-16: 4 mg via INTRAVENOUS
  Filled 2015-09-16: qty 1

## 2015-09-16 MED ORDER — HEPARIN SODIUM (PORCINE) 1000 UNIT/ML IJ SOLN
INTRAMUSCULAR | Status: DC | PRN
  Administered 2015-09-16: 5000 [IU] via INTRAVENOUS
  Administered 2015-09-16: 4000 [IU] via INTRAVENOUS

## 2015-09-16 MED ORDER — IOHEXOL 350 MG/ML SOLN
INTRAVENOUS | Status: DC | PRN
Start: 2015-09-16 — End: 2015-09-16
  Administered 2015-09-16: 180 mL via INTRACARDIAC

## 2015-09-16 MED ORDER — PANTOPRAZOLE SODIUM 40 MG PO TBEC
40.0000 mg | DELAYED_RELEASE_TABLET | Freq: Every day | ORAL | Status: DC
  Administered 2015-09-16 – 2015-09-17 (×2): 40 mg via ORAL
  Filled 2015-09-16 (×2): qty 1

## 2015-09-16 MED ORDER — NITROGLYCERIN IN D5W 200-5 MCG/ML-% IV SOLN
5.0000 ug/min | INTRAVENOUS | Status: DC
  Administered 2015-09-16: 7.5 ug/min via INTRAVENOUS
  Administered 2015-09-16: 5 ug/min via INTRAVENOUS
  Filled 2015-09-16: qty 250

## 2015-09-16 MED ORDER — ASPIRIN 81 MG PO CHEW: 81.0000 mg | CHEWABLE_TABLET | ORAL | Status: DC

## 2015-09-16 MED ORDER — HEART ATTACK BOUNCING BOOK
Freq: Once | Status: AC
  Administered 2015-09-16: 20:00:00
  Filled 2015-09-16: qty 1

## 2015-09-16 MED ORDER — LOSARTAN POTASSIUM 50 MG PO TABS
100.0000 mg | ORAL_TABLET | Freq: Every day | ORAL | Status: DC
  Administered 2015-09-17: 11:00:00 100 mg via ORAL
  Filled 2015-09-16: qty 2

## 2015-09-16 MED ORDER — VERAPAMIL HCL 2.5 MG/ML IV SOLN
INTRAVENOUS | Status: AC
  Filled 2015-09-16: qty 2

## 2015-09-16 MED ORDER — ONDANSETRON HCL 4 MG/2ML IJ SOLN: 4.0000 mg | Freq: Four times a day (QID) | INTRAMUSCULAR | Status: DC | PRN

## 2015-09-16 MED ORDER — SODIUM CHLORIDE 0.9 % WEIGHT BASED INFUSION
3.0000 mL/kg/h | INTRAVENOUS | Status: DC
Start: 2015-09-17 — End: 2015-09-16

## 2015-09-16 MED ORDER — OXYCODONE HCL 5 MG PO TABS
30.0000 mg | ORAL_TABLET | ORAL | Status: DC | PRN
  Administered 2015-09-16 – 2015-09-17 (×5): 30 mg via ORAL
  Filled 2015-09-16 (×6): qty 6

## 2015-09-16 MED ORDER — MIDAZOLAM HCL 2 MG/2ML IJ SOLN
INTRAMUSCULAR | Status: DC | PRN
  Administered 2015-09-16: 1 mg via INTRAVENOUS
  Administered 2015-09-16: 2 mg via INTRAVENOUS

## 2015-09-16 MED ORDER — HEPARIN BOLUS VIA INFUSION
4000.0000 [IU] | Freq: Once | INTRAVENOUS | Status: AC
  Administered 2015-09-16: 4000 [IU] via INTRAVENOUS

## 2015-09-16 MED ORDER — LORAZEPAM 2 MG/ML IJ SOLN
1.0000 mg | Freq: Once | INTRAMUSCULAR | Status: AC
  Administered 2015-09-16: 1 mg via INTRAVENOUS

## 2015-09-16 MED ORDER — NITROGLYCERIN 1 MG/10 ML FOR IR/CATH LAB
INTRA_ARTERIAL | Status: AC
  Filled 2015-09-16: qty 10

## 2015-09-16 MED ORDER — SPIRONOLACTONE 50 MG PO TABS
50.0000 mg | ORAL_TABLET | Freq: Two times a day (BID) | ORAL | Status: DC
Start: 2015-09-16 — End: 2015-09-17
  Administered 2015-09-16 – 2015-09-17 (×2): 50 mg via ORAL
  Filled 2015-09-16 (×3): qty 1

## 2015-09-16 MED ORDER — ASPIRIN 81 MG PO CHEW
CHEWABLE_TABLET | ORAL | Status: AC
  Filled 2015-09-16: qty 1

## 2015-09-16 SURGICAL SUPPLY — 23 items
BALLN EMERGE MR 2.25X12 (BALLOONS) ×3
BALLN ~~LOC~~ EMERGE MR 2.5X12 (BALLOONS) ×3
BALLN ~~LOC~~ EMERGE MR 3.25X12 (BALLOONS) ×3
BALLOON EMERGE MR 2.25X12 (BALLOONS) ×1 IMPLANT
BALLOON ~~LOC~~ EMERGE MR 2.5X12 (BALLOONS) ×1 IMPLANT
BALLOON ~~LOC~~ EMERGE MR 3.25X12 (BALLOONS) ×1 IMPLANT
CATH INFINITI 5 FR JL3.5 (CATHETERS) ×3 IMPLANT
CATH INFINITI 5FR ANG PIGTAIL (CATHETERS) ×3 IMPLANT
CATH INFINITI JR4 5F (CATHETERS) ×3 IMPLANT
CATH VISTA GUIDE 6FR XB3.5 (CATHETERS) ×3 IMPLANT
DEVICE RAD COMP TR BAND LRG (VASCULAR PRODUCTS) ×3 IMPLANT
GLIDESHEATH SLEND SS 6F .021 (SHEATH) ×3 IMPLANT
KIT ENCORE 26 ADVANTAGE (KITS) ×3 IMPLANT
KIT HEART LEFT (KITS) ×3 IMPLANT
PACK CARDIAC CATHETERIZATION (CUSTOM PROCEDURE TRAY) ×3 IMPLANT
STENT SYNERGY DES 2.25X16 (Permanent Stent) ×3 IMPLANT
STENT SYNERGY DES 3X24 (Permanent Stent) ×3 IMPLANT
SYR MEDRAD MARK V 150ML (SYRINGE) ×3 IMPLANT
TRANSDUCER W/STOPCOCK (MISCELLANEOUS) ×3 IMPLANT
TUBING CIL FLEX 10 FLL-RA (TUBING) ×3 IMPLANT
WIRE COUGAR XT STRL 190CM (WIRE) ×3 IMPLANT
WIRE HI TORQ VERSACORE-J 145CM (WIRE) ×3 IMPLANT
WIRE SAFE-T 1.5MM-J .035X260CM (WIRE) ×3 IMPLANT

## 2015-09-16 NOTE — ED Notes (Signed)
Patient snoring. VSS.

## 2015-09-16 NOTE — ED Notes (Signed)
MD Zammit at bedside updating patient and family.  

## 2015-09-16 NOTE — Consult Note (Signed)
  CARDIOLOGY CONSULT NOTE   Shea ID: Danny Shea MRN: [id number] DOB/AGE: 01/16/1966 49 y.o.  Admit Date: 09/16/2015 Referring Physician: ER- Danny Shea Primary Physician: MANN, BENJAMIN, PA-C Consulting Cardiologist: Branch, Jonathan MD Primary Cardiologist: Danny Shea Reason for Consultation: NSEMI  Clinical Summary Danny Shea is a 49 y.o.male with known history of hypertension, CHF, hypertensive heart disease, OSA, Danny Shea had been followed by Danny Shea, Dr.Peter J. Shea and by and EP cardiologist.Danny Shea, M.D. Danny Shea has had a VT ablation in 2010,VT s/p ablation in Danny past but had been seen by Dr. Rothbart in 2014. Had cardiac cath 12/21/2010 which demonstrated non-critical mild plaquing of Danny Shea. There was no other obstructive disease in remaining coronary arteries (per scanned report in CV procedures). Danny Shea has since transferred cardiac care to Dr. Clevenger in [place]. Saw Danny Shea last in Sept of 2015. Was told to follow up prn as Danny Shea did not have any cardiac abnormalities.   Danny Shea has been given morphine and is difficult to keep awake. Hx is obtained via prior medical records and from fiance' at bedside.  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 Danny Shea's fianc states that Danny Shea has been feeling bad all week while working in [place]. Danny Shea has been having constant chest pain. Danny Shea was followed by pain management for chronic pain.  When Danny Shea got home Danny Shea began to have severe pain, fiance states that Danny Shea was writhing and holding his chest. She brought into Danny emergency room that Danny Shea decided not to come in and instead take some aspirin and go home. Danny Shea had taken some pain medication at home and was sitting in a recliner. Danny Shea got up to use Danny bathroom and she states Danny Shea passed out in Danny bathroom. His fiance became concerned and called EMS to bring Danny Shea to ER.  Danny Shea is sedated, but when I do awaken Danny Shea to assess  Danny Shea, Danny Shea cries out in pain while I am touching his chest or with moving of his torso or sitting up on Danny stretcher.  On arrival to Danny emergency room blood pressure was 147/86, heart rate 73, O2 sat 95%. Danny Shea had mildly elevated white blood cells of 14.3, neutrophils 12.0. sodium 139, potassium 3.6. ALT was mildly low at 14. Troponin 1.53. EKG revealed normal sinus rhythm, rate of 61 bpm without acute ST-T wave changes. Danny Shea has been placed on heparin, nitroglycerin, and as stated given morphine.  Allergies  Allergen Reactions  . Darvocet [Propoxyphene N-Acetaminophen] Other (See Comments)    Unknown reaction  . Feldene [Piroxicam] Other (See Comments)    Unknown reaction  . Penicillins Hives    Medications Scheduled Medications:    Infusions: . heparin 1,000 Units/hr (09/16/15 0943)  . nitroGLYCERIN 5 mcg/min (09/16/15 0949)    PRN Medications:     Past Medical History  Diagnosis Date  . Hypertension   . Hyperlipidemia   . Chest pain     Multiple episodes of chest pain; cath 11/2008: No CAD, normal EF  . Ventricular tachycardia     radiofrequency ablation in 12/2008; H/o myocardial infarction, but no coronary artery disease on coronary angiography or wall motion abnormality on echo  . Syncope     recurrent resulting in hospitalization in 01/2013; hypotensive with acute renal failure, likely medication induced  . Tobacco abuse     45 pack years; 2013-consumption reduced to 0.5 pack per day  . Degenerative joint disease       Lumbosacral spine; chronic low back pain  . MRSA (methicillin resistant Staphylococcus aureus) infection   . Depression   . TIA (transient ischemic attack)   . Chronic back pain   . Atrial fibrillation   . Sleep apnea 02/21/2015  . Vitamin B12 deficiency 02/21/2015  . Stroke     Past Surgical History  Procedure Laterality Date  . Radiofrequency ablation      Ventricular tachycardia    Family History  Problem Relation Age of Onset  . Heart attack  Mother     Coronary artery disease, prior PCI  . Heart failure Mother   . Heart attack Brother     Prior PCI & CHF;  also cousin prior to Danny age of 30  . Renal Disease Brother   . Cancer Father     Also mother  . Asthma Sister     Social History Mr. Danny Shea reports that Danny Shea has been smoking Cigarettes.  Danny Shea has been smoking about 0.50 packs per day. Danny Shea has never used smokeless tobacco. Mr. Danny Shea reports that Danny Shea does not drink alcohol.  Review of Systems Complete review of systems are found to be negative unless outlined in H&P above.  Physical Examination Blood pressure 102/92, pulse 82, temperature 97.7 F (36.5 C), temperature source Oral, resp. rate 14, height 5' 11" (1.803 m), weight 201 lb (91.173 kg), SpO2 99 %. No intake or output data in Danny 24 hours ending 09/16/15 1018  Telemetry: Normal sinus rhythm, sinus bradycardia.  GEN: Sedated but will awaken to verbal stimuli and follows commands HEENT: Conjunctiva and lids normal, oropharynx clear with moist mucosa. Neck: Supple, no elevated JVP or carotid bruits, no thyromegaly. Lungs: Clear to auscultation, nonlabored breathing at rest. Cardiac: Regular rate and rhythm, no S3 or significant systolic murmur, no pericardial rub. Abdomen: Soft, nontender, no hepatomegaly, bowel sounds present, no guarding or rebound. Extremities: No pitting edema, distal pulses 2+. Skin: Warm and dry. Musculoskeletal: No kyphosis. Pain with movement of torso, sitting up, and palpation of his chest. Neuropsychiatric: Alert and oriented x3, affect grossly appropriate.  Prior Cardiac Testing/Procedures 1. Echocardiogram: 08/24/2015.  Left ventricle: Danny cavity size was normal. There was mild tomoderate concentric hypertrophy. Systolic function was normal. Danny estimated ejection fraction was in Danny range of 55% to 60%. Wall motion was normal; there were no regional wall motion abnormalities. - Left atrium: Danny atrium was mildly to  moderately dilated. - Right ventricle: Danny cavity size was mildly dilated. Wall thickness was normal. - Right atrium: Danny atrium was mildly to moderately dilated.  2. Cardiac cath (per scanned document) 12/21/2010 Non-critical mild plaquing involving Danny Shea. Danny other coronaries were free of disease. RCA: Extremely small non-dominant vessel, free of disease.  Chest pain syndrome noncardiac.   Lab Results  Basic Metabolic Panel:  Recent Labs Lab 09/16/15 0922  NA 139  K 3.6  CL 108  CO2 23  GLUCOSE 102*  BUN 20  CREATININE 1.14  CALCIUM 9.4    Liver Function Tests:  Recent Labs Lab 09/16/15 0922  AST 38  ALT 14*  ALKPHOS 58  BILITOT 0.7  PROT 6.7  ALBUMIN 4.1    CBC:  Recent Labs Lab 09/16/15 0922  WBC 14.3*  NEUTROABS 12.0*  HGB 14.2  HCT 40.9  MCV 89.1  PLT 246    Cardiac Enzymes: No results for input(s): CKTOTAL, CKMB, CKMBINDEX, TROPONINI in Danny last 168 hours.  Radiology: Dg Chest 2 View  09/16/2015   CLINICAL DATA:  Left-sided   chest and shoulder pain. Nausea vomiting.  EXAM: CHEST - 2 VIEW  COMPARISON:  Two-view chest x-ray 01/05/2015.  FINDINGS: Heart size is normal. Chronic interstitial coarsening is stable. No focal airspace disease is evident. Danny visualized soft tissues and bony thorax are unremarkable.  IMPRESSION: 1. No acute cardiopulmonary disease or significant interval change. 2. Stable chronic interstitial coarsening, likely related to smoking.   Electronically Signed   By: Christopher  Mattern M.D.   On: 09/16/2015 08:40     ECG: Normal sinus rhythm. No acute ST-T wave changes.   Impression and Recommendations  1. Chest pain: Pain is atypical in that it has been constant for 24 hours, reproducible with movement of his torso, sitting up, or palpation of his left chest. Danny Shea has been experiencing chronic pain for several years, and did take some pain medication prior to coming in. Danny Shea did have a syncopal episode after  getting up from a recliner and walking to Danny bathroom. Danny Shea troponin is elevated at 1.54. This may be related to fall in Danny bathroom. There are no acute ST-T wave changes noted on EKG.   2. Hypertension: Fianc states that his blood pressure is been up and down. Danny Shea is on clonidine 0.1 mg 3 times a day, aspirin, lactulose 50 mg by mouth twice a day. I am uncertain when Danny Shea take his medications last as Danny Shea is a poor historian at this time with morphine on board. Currently well-controlled on nitroglycerin drip. Danny Shea has a history of uncontrolled hypertension.  3. Non-obstructive CAD: Most recent cardiac catheterization completed 2012 revealed minimal plaquing of Danny circumflex. Danny Shea is being followed by Dr. Clevenger, cardiology, and even, who has released Danny Shea to be seen when necessary as Danny Shea found no cardiac issues-per Shea's fianc.  4. History of CVA in January 2016. MRI at that time revealed small questional area of restricted diffusion in Danny central midbrain. Danny Shea was seen by neurology who recommended aspirin. Danny Shea was to follow-up as an outpatient. It was felt that CVA was related to uncontrolled hypertension by neurology when seen on follow-up in March 2016.   5. Ongoing tobacco abuse: Danny Shea smokes 1-1/2-2 packs a day. Cessation is recommended.  6. Chronic back pain and musculoskeletal pain: Followed by pain management and primary care. Danny Shea takes oxycodone, and Xanax when necessary. Danny Shea apparently has degenerative disc disease per his fianc.  7. OSA: Neurology set Danny Shea up for a sleep study to evaluate more thoroughly, as Danny Shea had significant desaturations at night which were found to be consistent with obstructive sleep apnea.     Signed: Kathryn M. Lawrence NP [facility]  09/16/2015, 10:18 AM Co-Sign MD  Shea seen and discussed with NP Lawerence, I agree with her documentation. 49 yo male history of VT with prior ablation in 2010, HTN, HL, CVA. His records are somewhat unclear as Danny Shea has  been followed in Danny past by [facility] a few years ago and has had poor follow up in our clinics. Danny Shea presents with chest pain. History is limited as Danny Shea is heavily sedated with morphine in Danny ER, it is collected by Danny Shea and his fiancee who is with Danny Shea. Reports episodes of severe sharp chest pain midchest on and off since yesterday. Danny Shea reports symptoms would last up to 15 minutes, resolve and then come back. Danny Shea apparently also had an episode of syncope yesterday that Danny Shea is not able to describe clearly. His fiancee found Danny Shea on Danny floor responsive but lethargic.      ER vitals p 73 bp 147/86 99% RA 01/2015 echo LVEF 55-60%, no WMAs Trop 1.5, K 3.6, Cr 1.14, Hgb 14.2, WBC 14.3 CXR no acute process EKG NSR, LVH, no ischemic changes  Difficult history to collect as Shea is heavily sedated. Danny Shea does report chest pain on and off since yesterday. EKG is normal but signicicant troponin elevation to 1.5 indicating cardiac ischemia. Danny Shea is on heparin gtt and nitro gtt, we will plan for transfer to [facility] today for cath. Syncope history remains unclear if any relation to cardiac etiology. Will monitor on telemetry during admit due to history of VT s/p ablation, f/u cath results, will need inpatient echo. Danny Shea is on opiates at home and xanax at home, unclear if could have contributed. Danny Shea took ASA at home prior to arrival.    J Branch MD  

## 2015-09-16 NOTE — Interval H&P Note (Signed)
History and Physical Interval Note:  09/16/2015 4:01 PM  Danny Shea  has presented today for surgery, with the diagnosis of cp  The various methods of treatment have been discussed with the patient and family. After consideration of risks, benefits and other options for treatment, the patient has consented to  Procedure(s): Left Heart Cath and Coronary Angiography (N/A) as a surgical intervention .  The patient's history has been reviewed, patient examined, no change in status, stable for surgery.  I have reviewed the patient's chart and labs.  Questions were answered to the patient's satisfaction.    Cath Lab Visit (complete for each Cath Lab visit)  Clinical Evaluation Leading to the Procedure:   ACS: Yes.    Non-ACS:    Anginal Classification: CCS IV  Anti-ischemic medical therapy: Minimal Therapy (1 class of medications)  Non-Invasive Test Results: No non-invasive testing performed  Prior CABG: No previous CABG       Tonny Bollman

## 2015-09-16 NOTE — ED Provider Notes (Signed)
CSN: [id number]     Arrival date & time 09/16/15  7829 History  By signing my name below, I, Freida Busman, attest that this documentation has been prepared under the direction and in the presence of Bethann Berkshire, MD . Electronically Signed: Freida Busman, Scribe. 09/16/2015. 10:02 AM.    Chief Complaint  Patient presents with  . Chest Pain     Patient is a 49 y.o. male presenting with chest pain. The history is provided by the patient. No language interpreter was used.  Chest Pain Pain location:  L chest Pain quality: sharp   Pain radiates to:  L arm Pain radiates to the back: yes   Pain severity:  Moderate Timing:  Intermittent Chronicity:  New Associated symptoms: back pain and shortness of breath   Associated symptoms: no abdominal pain, no cough, no fatigue and no headache   Risk factors: coronary artery disease, high cholesterol and hypertension     HPI Comments:  ALYAS CREARY is a 49 y.o. male with a history of HTN, HLD, Afib and VTach, who presents to the Emergency Department complaining of intermittent sharp left sided chest pain that started last night. He notes his pain radiates to his LUE and to his left back. Pt reports associated mild SOB. He also reports a history of similar pain when he was in [place]  ~ 6 years ago. He has taken ASA and nitro PTA without relief.   Past Medical History  Diagnosis Date  . Hypertension   . Hyperlipidemia   . Chest pain     Multiple episodes of chest pain; cath 11/2008: No CAD, normal EF  . Ventricular tachycardia     radiofrequency ablation in 12/2008; H/o myocardial infarction, but no coronary artery disease on coronary angiography or wall motion abnormality on echo  . Syncope     recurrent resulting in hospitalization in 01/2013; hypotensive with acute renal failure, likely medication induced  . Tobacco abuse     45 pack years; 2013-consumption reduced to 0.5 pack per day  . Degenerative joint disease     Lumbosacral spine;  chronic low back pain  . MRSA (methicillin resistant Staphylococcus aureus) infection   . Depression   . TIA (transient ischemic attack)   . Chronic back pain   . Atrial fibrillation   . Sleep apnea 02/21/2015  . Vitamin B12 deficiency 02/21/2015  . Stroke    Past Surgical History  Procedure Laterality Date  . Radiofrequency ablation      Ventricular tachycardia   Family History  Problem Relation Age of Onset  . Heart attack Mother     Coronary artery disease, prior PCI  . Heart failure Mother   . Heart attack Brother     Prior PCI & CHF;  also cousin prior to the age of 66  . Renal Disease Brother   . Cancer Father     Also mother  . Asthma Sister    Social History  Substance Use Topics  . Smoking status: Current Every Day Smoker -- 0.50 packs/day    Types: Cigarettes  . Smokeless tobacco: Never Used     Comment: half a pack for 3 months   . Alcohol Use: No     Comment: denies     Review of Systems  Constitutional: Negative for appetite change and fatigue.  HENT: Negative for congestion, ear discharge and sinus pressure.   Eyes: Negative for discharge.  Respiratory: Positive for shortness of breath. Negative for cough.  Cardiovascular: Positive for chest pain.  Gastrointestinal: Negative for abdominal pain and diarrhea.  Genitourinary: Negative for frequency and hematuria.  Musculoskeletal: Positive for myalgias (LUE) and back pain.  Skin: Negative for rash.  Neurological: Negative for seizures and headaches.  Psychiatric/Behavioral: Negative for hallucinations.  All other systems reviewed and are negative.   Allergies  Darvocet; Feldene; and Penicillins  Home Medications   Prior to Admission medications   Medication Sig Start Date End Date Taking? Authorizing Provider  ALPRAZolam Prudy Feeler) 1 MG tablet Take 1 mg by mouth 3 (three) times daily as needed for anxiety.   Yes Historical Provider, MD  amLODipine (NORVASC) 10 MG tablet Take 10 mg by mouth daily.   Yes  Historical Provider, MD  aspirin 325 MG tablet Take 1 tablet (325 mg total) by mouth daily. 01/06/15  Yes Jerald Kief, MD  aspirin EC 81 MG tablet Take 81 mg by mouth daily.   Yes Historical Provider, MD  cloNIDine (CATAPRES) 0.1 MG tablet Take 0.1 mg by mouth 3 (three) times daily.   Yes Historical Provider, MD  diphenhydrAMINE (BENADRYL) 50 MG tablet Take 50 mg by mouth at bedtime.   Yes Historical Provider, MD  furosemide (LASIX) 20 MG tablet Take 20 mg by mouth 2 (two) times daily as needed for fluid.   Yes Historical Provider, MD  losartan (COZAAR) 100 MG tablet Take 100 mg by mouth daily.  02/03/13  Yes Historical Provider, MD  meloxicam (MOBIC) 15 MG tablet Take 15 mg by mouth daily. 12/31/14  Yes Historical Provider, MD  nitroGLYCERIN (NITROSTAT) 0.4 MG SL tablet Place 0.4 mg under the tongue every 5 (five) minutes as needed.   Yes Historical Provider, MD  omeprazole (PRILOSEC) 40 MG capsule Take 40 mg by mouth daily.   Yes Historical Provider, MD  oxycodone (ROXICODONE) 30 MG immediate release tablet Take 30 mg by mouth every 4 (four) hours as needed for pain.   Yes Historical Provider, MD  promethazine (PHENERGAN) 25 MG tablet Take 25 mg by mouth every 8 (eight) hours as needed for nausea.    Yes Historical Provider, MD  spironolactone (ALDACTONE) 50 MG tablet Take 50 mg by mouth 2 (two) times daily.   Yes Historical Provider, MD  vitamin B-12 (CYANOCOBALAMIN) 1000 MCG tablet Take 1,000 mcg by mouth daily.   Yes Historical Provider, MD   BP 166/113 mmHg  Pulse 72  Temp(Src) 97.7 F (36.5 C) (Oral)  Resp 22  Ht  (1.803 m)  Wt 201 lb (91.173 kg)  BMI 28.05 kg/m2  SpO2 98% Physical Exam  Constitutional: He is oriented to person, place, and time. He appears well-developed.  Mildly lethargic  HENT:  Head: Normocephalic.  Eyes: Conjunctivae and EOM are normal. No scleral icterus.  Neck: Neck supple. No thyromegaly present.  Cardiovascular: Normal rate and regular rhythm.   Exam reveals no gallop and no friction rub.   No murmur heard. Pulmonary/Chest: No stridor. He has no wheezes. He has no rales. He exhibits no tenderness.  Abdominal: He exhibits no distension. There is no tenderness. There is no rebound.  Musculoskeletal: Normal range of motion. He exhibits no edema.  Lymphadenopathy:    He has no cervical adenopathy.  Neurological: He is oriented to person, place, and time. He exhibits normal muscle tone. Coordination normal.  Skin: No rash noted. No erythema.  Psychiatric: He has a normal mood and affect. His behavior is normal.  Nursing note and vitals reviewed.   ED Course  Procedures  DIAGNOSTIC STUDIES:  Oxygen Saturation is 95% on RA, adequate by my interpretation.    COORDINATION OF CARE:  8:18 AM Will order blood work and CXR. Discussed treatment plan with pt at bedside and pt agreed to plan.   10:01 AM Spoke with Cardiologist who will assess pt at bedside and make arrangements to transfer pt to [facility] Review Labs Reviewed  CBC WITH DIFFERENTIAL/PLATELET - Abnormal; Notable for the following:    WBC 14.3 (*)    Neutro Abs 12.0 (*)    All other components within normal limits  I-STAT TROPOININ, ED - Abnormal; Notable for the following:    Troponin i, poc 1.53 (*)    All other components within normal limits  COMPREHENSIVE METABOLIC PANEL    Imaging Review Dg Chest 2 View  09/16/2015   CLINICAL DATA:  Left-sided chest and shoulder pain. Nausea vomiting.  EXAM: CHEST - 2 VIEW  COMPARISON:  Two-view chest x-ray 01/05/2015.  FINDINGS: Heart size is normal. Chronic interstitial coarsening is stable. No focal airspace disease is evident. The visualized soft tissues and bony thorax are unremarkable.  IMPRESSION: 1. No acute cardiopulmonary disease or significant interval change. 2. Stable chronic interstitial coarsening, likely related to smoking.   Electronically Signed   By: Marin Roberts M.D.   On: 09/16/2015 08:40    I have personally reviewed and evaluated these images and lab results as part of my medical decision-making.   EKG Interpretation   Date/Time:  Friday September 16 2015 09:35:25 EDT Ventricular Rate:  65 PR Interval:  112 QRS Duration: 98 QT Interval:  390 QTC Calculation: 405 R Axis:   75 Text Interpretation:  Sinus rhythm Borderline short PR interval Confirmed  by ZAMMIT  MD, JOSEPH [phone]) on 09/16/2015 11:39:11 AM     CRITICAL CARE Performed by: ZAMMIT,JOSEPH L Total critical care time:40 Critical care time was exclusive of separately billable procedures and treating other patients. Critical care was necessary to treat or prevent imminent or life-threatening deterioration. Critical care was time spent personally by me on the following activities: development of treatment plan with patient and/or surrogate as well as nursing, discussions with consultants, evaluation of patient's response to treatment, examination of patient, obtaining history from patient or surrogate, ordering and performing treatments and interventions, ordering and review of laboratory studies, ordering and review of radiographic studies, pulse oximetry and re-evaluation of patient's condition.  MDM   Final diagnoses:  None   Patient was seen by cardiology and will be admitted over at [facility].  Bethann Berkshire, MD 09/16/15 1140

## 2015-09-16 NOTE — ED Notes (Signed)
Patient rates 8/10, but falls asleep immediatly after rating pain.

## 2015-09-16 NOTE — ED Notes (Signed)
Patient rating pain 8.5/10. Morphine  IV will be given prior to [facility] transport.

## 2015-09-16 NOTE — ED Notes (Signed)
Hospitalist in room with pt and family. Pt sleeping at present.

## 2015-09-16 NOTE — H&P (View-Only) (Signed)
CARDIOLOGY CONSULT NOTE   Patient ID: Danny Shea MRN: [id number] DOB/AGE: October 14, 1966 49 y.o.  Admit Date: 09/16/2015 Referring Physician: ER- Zammitt Primary Physician: Lenise Herald, PA-C Consulting Cardiologist: Dina Rich MD Primary Cardiologist: Formerly Dietrich Pates Reason for Consultation: NSEMI  Clinical Summary [name] is a 49 y.o.male with known history of hypertension, CHF, hypertensive heart disease, OSA, He had been followed by [facility], Dr.Peter Ronnald Ramp and by and EP cardiologist.Mark D. Bernette Mayers, M.D. He has had a VT ablation in 2010,VT s/p ablation in the past but had been seen by Dr. Dietrich Pates in 2014. Had cardiac cath 12/21/2010 which demonstrated non-critical mild plaquing of the LAD. There was no other obstructive disease in remaining coronary arteries (per scanned report in CV procedures). He has since transferred cardiac care to Dr. Molly Maduro in [place]. Saw him last in Sept of 2015. Was told to follow up prn as he did not have any cardiac abnormalities.   He has been given morphine and is difficult to keep awake. Hx is obtained via prior medical records and from fiance' at bedside.  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 The patient's fianc states that he has been feeling bad all week while working in [place]. He has been having constant chest pain. The patient was followed by pain management for chronic pain.  When he got home he began to have severe pain, fiance states that he was writhing and holding his chest. She brought into the emergency room that he decided not to come in and instead take some aspirin and go home. He had taken some pain medication at home and was sitting in a recliner. He got up to use the bathroom and she states he passed out in the bathroom. His fiance became concerned and called EMS to bring him to ER.  The patient is sedated, but when I do awaken him to assess  him, he cries out in pain while I am touching his chest or with moving of his torso or sitting up on the stretcher.  On arrival to the emergency room blood pressure was 147/86, heart rate 73, O2 sat 95%. He had mildly elevated white blood cells of 14.3, neutrophils 12.0. sodium 139, potassium 3.6. ALT was mildly low at 14. Troponin 1.53. EKG revealed normal sinus rhythm, rate of 61 bpm without acute ST-T wave changes. He has been placed on heparin, nitroglycerin, and as stated given morphine.  Allergies  Allergen Reactions  . Darvocet [Propoxyphene N-Acetaminophen] Other (See Comments)    Unknown reaction  . Feldene [Piroxicam] Other (See Comments)    Unknown reaction  . Penicillins Hives    Medications Scheduled Medications:    Infusions: . heparin 1,000 Units/hr (09/16/15 0943)  . nitroGLYCERIN 5 mcg/min (09/16/15 0949)    PRN Medications:     Past Medical History  Diagnosis Date  . Hypertension   . Hyperlipidemia   . Chest pain     Multiple episodes of chest pain; cath 11/2008: No CAD, normal EF  . Ventricular tachycardia     radiofrequency ablation in 12/2008; H/o myocardial infarction, but no coronary artery disease on coronary angiography or wall motion abnormality on echo  . Syncope     recurrent resulting in hospitalization in 01/2013; hypotensive with acute renal failure, likely medication induced  . Tobacco abuse     45 pack years; 2013-consumption reduced to 0.5 pack per day  . Degenerative joint disease  Lumbosacral spine; chronic low back pain  . MRSA (methicillin resistant Staphylococcus aureus) infection   . Depression   . TIA (transient ischemic attack)   . Chronic back pain   . Atrial fibrillation   . Sleep apnea 02/21/2015  . Vitamin B12 deficiency 02/21/2015  . Stroke     Past Surgical History  Procedure Laterality Date  . Radiofrequency ablation      Ventricular tachycardia    Family History  Problem Relation Age of Onset  . Heart attack  Mother     Coronary artery disease, prior PCI  . Heart failure Mother   . Heart attack Brother     Prior PCI & CHF;  also cousin prior to the age of 11  . Renal Disease Brother   . Cancer Father     Also mother  . Asthma Sister     Social History [name] reports that he has been smoking Cigarettes.  He has been smoking about 0.50 packs per day. He has never used smokeless tobacco. [name] reports that he does not drink alcohol.  Review of Systems Complete review of systems are found to be negative unless outlined in H&P above.  Physical Examination Blood pressure 102/92, pulse 82, temperature 97.7 F (36.5 C), temperature source Oral, resp. rate 14, height  (1.803 m), weight 201 lb (91.173 kg), SpO2 99 %. No intake or output data in the 24 hours ending 09/16/15 1018  Telemetry: Normal sinus rhythm, sinus bradycardia.  GEN: Sedated but will awaken to verbal stimuli and follows commands HEENT: Conjunctiva and lids normal, oropharynx clear with moist mucosa. Neck: Supple, no elevated JVP or carotid bruits, no thyromegaly. Lungs: Clear to auscultation, nonlabored breathing at rest. Cardiac: Regular rate and rhythm, no S3 or significant systolic murmur, no pericardial rub. Abdomen: Soft, nontender, no hepatomegaly, bowel sounds present, no guarding or rebound. Extremities: No pitting edema, distal pulses 2+. Skin: Warm and dry. Musculoskeletal: No kyphosis. Pain with movement of torso, sitting up, and palpation of his chest. Neuropsychiatric: Alert and oriented x3, affect grossly appropriate.  Prior Cardiac Testing/Procedures 1. Echocardiogram: 08/24/2015.  Left ventricle: The cavity size was normal. There was mild tomoderate concentric hypertrophy. Systolic function was normal. The estimated ejection fraction was in the range of 55% to 60%. Wall motion was normal; there were no regional wall motion abnormalities. - Left atrium: The atrium was mildly to  moderately dilated. - Right ventricle: The cavity size was mildly dilated. Wall thickness was normal. - Right atrium: The atrium was mildly to moderately dilated.  2. Cardiac cath (per scanned document) 12/21/2010 Non-critical mild plaquing involving the LAD. The other coronaries were free of disease. RCA: Extremely small non-dominant vessel, free of disease.  Chest pain syndrome noncardiac.   Lab Results  Basic Metabolic Panel:  Recent Labs Lab 09/16/15 0922  NA 139  K 3.6  CL 108  CO2 23  GLUCOSE 102*  BUN 20  CREATININE 1.14  CALCIUM 9.4    Liver Function Tests:  Recent Labs Lab 09/16/15 0922  AST 38  ALT 14*  ALKPHOS 58  BILITOT 0.7  PROT 6.7  ALBUMIN 4.1    CBC:  Recent Labs Lab 09/16/15 0922  WBC 14.3*  NEUTROABS 12.0*  HGB 14.2  HCT 40.9  MCV 89.1  PLT 246    Cardiac Enzymes: No results for input(s): CKTOTAL, CKMB, CKMBINDEX, TROPONINI in the last 168 hours.  Radiology: Dg Chest 2 View  09/16/2015   CLINICAL DATA:  Left-sided  chest and shoulder pain. Nausea vomiting.  EXAM: CHEST - 2 VIEW  COMPARISON:  Two-view chest x-ray 01/05/2015.  FINDINGS: Heart size is normal. Chronic interstitial coarsening is stable. No focal airspace disease is evident. The visualized soft tissues and bony thorax are unremarkable.  IMPRESSION: 1. No acute cardiopulmonary disease or significant interval change. 2. Stable chronic interstitial coarsening, likely related to smoking.   Electronically Signed   By: Marin Roberts M.D.   On: 09/16/2015 08:40     ECG: Normal sinus rhythm. No acute ST-T wave changes.   Impression and Recommendations  1. Chest pain: Pain is atypical in that it has been constant for 24 hours, reproducible with movement of his torso, sitting up, or palpation of his left chest. The patient has been experiencing chronic pain for several years, and did take some pain medication prior to coming in. The patient did have a syncopal episode after  getting up from a recliner and walking to the bathroom. The patient troponin is elevated at 1.54. This may be related to fall in the bathroom. There are no acute ST-T wave changes noted on EKG.   2. Hypertension: Neysa Bonito states that his blood pressure is been up and down. He is on clonidine 0.1 mg 3 times a day, aspirin, lactulose 50 mg by mouth twice a day. I am uncertain when he take his medications last as he is a poor historian at this time with morphine on board. Currently well-controlled on nitroglycerin drip. He has a history of uncontrolled hypertension.  3. Non-obstructive CAD: Most recent cardiac catheterization completed 2012 revealed minimal plaquing of the circumflex. He is being followed by Dr. Molly Maduro, cardiology, and even, who has released him to be seen when necessary as he found no cardiac issues-per patient's fianc.  4. History of CVA in January 2016. MRI at that time revealed small questional area of restricted diffusion in the central midbrain. He was seen by neurology who recommended aspirin. He was to follow-up as an outpatient. It was felt that CVA was related to uncontrolled hypertension by neurology when seen on follow-up in March 2016.   5. Ongoing tobacco abuse: He smokes 1-1/2-2 packs a day. Cessation is recommended.  6. Chronic back pain and musculoskeletal pain: Followed by pain management and primary care. The patient takes oxycodone, and Xanax when necessary. He apparently has degenerative disc disease per his fianc.  7. OSA: Neurology set him up for a sleep study to evaluate more thoroughly, as he had significant desaturations at night which were found to be consistent with obstructive sleep apnea.     Signed: Bettey Mare. Lawrence NP [facility]  09/16/2015, 10:18 AM Co-Sign MD  Patient seen and discussed with NP Lawerence, I agree with her documentation. 49 yo male history of VT with prior ablation in 2010, HTN, HL, CVA. His records are somewhat unclear as he has  been followed in the past by [facility] a few years ago and has had poor follow up in our clinics. He presents with chest pain. History is limited as he is heavily sedated with morphine in the ER, it is collected by him and his fiancee who is with him. Reports episodes of severe sharp chest pain midchest on and off since yesterday. He reports symptoms would last up to 15 minutes, resolve and then come back. He apparently also had an episode of syncope yesterday that he is not able to describe clearly. His fiancee found him on the floor responsive but lethargic.  ER vitals p 73 bp 147/86 99% RA 01/2015 echo LVEF 55-60%, no WMAs Trop 1.5, K 3.6, Cr 1.14, Hgb 14.2, WBC 14.3 CXR no acute process EKG NSR, LVH, no ischemic changes  Difficult history to collect as patient is heavily sedated. He does report chest pain on and off since yesterday. EKG is normal but signicicant troponin elevation to 1.5 indicating cardiac ischemia. He is on heparin gtt and nitro gtt, we will plan for transfer to [facility] today for cath. Syncope history remains unclear if any relation to cardiac etiology. Will monitor on telemetry during admit due to history of VT s/p ablation, f/u cath results, will need inpatient echo. He is on opiates at home and xanax at home, unclear if could have contributed. He took ASA at home prior to arrival.    Dominga Ferry MD

## 2015-09-16 NOTE — Progress Notes (Signed)
ANTICOAGULATION CONSULT NOTE - Initial Consult  Pharmacy Consult for heparin Indication: chest pain/ACS  Allergies  Allergen Reactions  . Darvocet [Propoxyphene N-Acetaminophen] Other (See Comments)    Unknown reaction  . Feldene [Piroxicam] Other (See Comments)    Unknown reaction  . Penicillins Hives    Patient Measurements: Height:  (180.3 cm) Weight: 201 lb (91.173 kg) IBW/kg (Calculated) : 75.3  Vital Signs: Temp: 97.9 F (36.6 C) (09/30 1236) Temp Source: Oral (09/30 1236) BP: 160/71 mmHg (09/30 1356) Pulse Rate: 64 (09/30 1356)  Labs:  Recent Labs  09/16/15 0922  HGB 14.2  HCT 40.9  PLT 246  CREATININE 1.14    Estimated Creatinine Clearance: 88.6 mL/min (by C-G formula based on Cr of 1.14).   Medical History: Past Medical History  Diagnosis Date  . Hypertension   . Hyperlipidemia   . Chest pain     Multiple episodes of chest pain; cath 11/2008: No CAD, normal EF  . Ventricular tachycardia     radiofrequency ablation in 12/2008; H/o myocardial infarction, but no coronary artery disease on coronary angiography or wall motion abnormality on echo  . Syncope     recurrent resulting in hospitalization in 01/2013; hypotensive with acute renal failure, likely medication induced  . Tobacco abuse     45 pack years; 2013-consumption reduced to 0.5 pack per day  . Degenerative joint disease     Lumbosacral spine; chronic low back pain  . MRSA (methicillin resistant Staphylococcus aureus) infection   . Depression   . TIA (transient ischemic attack)   . Chronic back pain   . Atrial fibrillation   . Sleep apnea 02/21/2015  . Vitamin B12 deficiency 02/21/2015  . Stroke     Medications:  Prescriptions prior to admission  Medication Sig Dispense Refill Last Dose  . ALPRAZolam (XANAX) 1 MG tablet Take 1 mg by mouth 3 (three) times daily as needed for anxiety.   09/15/2015 at Unknown time  . amLODipine (NORVASC) 10 MG tablet Take 10 mg by mouth daily.    09/16/2015 at Unknown time  . aspirin 325 MG tablet Take 1 tablet (325 mg total) by mouth daily. 30 tablet 0 09/16/2015 at Unknown time  . aspirin EC 81 MG tablet Take 81 mg by mouth daily.   09/16/2015 at Unknown time  . cloNIDine (CATAPRES) 0.1 MG tablet Take 0.1 mg by mouth 3 (three) times daily.   09/16/2015 at Unknown time  . diphenhydrAMINE (BENADRYL) 50 MG tablet Take 50 mg by mouth at bedtime.   Past Week at Unknown time  . furosemide (LASIX) 20 MG tablet Take 20 mg by mouth 2 (two) times daily as needed for fluid.   09/15/2015 at Unknown time  . losartan (COZAAR) 100 MG tablet Take 100 mg by mouth daily.    09/16/2015 at Unknown time  . meloxicam (MOBIC) 15 MG tablet Take 15 mg by mouth daily.   09/16/2015 at Unknown time  . nitroGLYCERIN (NITROSTAT) 0.4 MG SL tablet Place 0.4 mg under the tongue every 5 (five) minutes as needed.   Unknown  . omeprazole (PRILOSEC) 40 MG capsule Take 40 mg by mouth daily.   Past Week at Unknown time  . oxycodone (ROXICODONE) 30 MG immediate release tablet Take 30 mg by mouth every 4 (four) hours as needed for pain.   09/16/2015 at Unknown time  . promethazine (PHENERGAN) 25 MG tablet Take 25 mg by mouth every 8 (eight) hours as needed for nausea.    Past  Week at Unknown time  . spironolactone (ALDACTONE) 50 MG tablet Take 50 mg by mouth 2 (two) times daily.   09/16/2015 at Unknown time  . vitamin B-12 (CYANOCOBALAMIN) 1000 MCG tablet Take 1,000 mcg by mouth daily.   09/15/2015 at Unknown time    Assessment: 49 y/o male who presented to [facility] ED with intermittent L shoulder pain radiating to chest. He also had a syncopal episode at home. Troponin is 1.53. Last cath showed nonobstructive CAD. He was transferred to [facility] for cath. Pharmacy consulted to begin IV heparin.   Heparin was started at [facility] with 4000 unit IV bolus then 1000 units/hr at 09:45. No bleeding noted, CBC is normal.  Goal of Therapy:  Heparin level 0.3-0.7 units/ml Monitor platelets by anticoagulation  protocol: Yes   Plan:  - Increase heparin drip to 1100 units/hr - F/u after cath, on schedule for 16:30 - if cancelled would obtain heparin level  [facility], [place].D., BCPS Clinical Pharmacist Pager: [phone] 09/16/2015 2:31 PM

## 2015-09-16 NOTE — Progress Notes (Signed)
Pt arrived from [facility] via [facility] , Heparin infusing. Chest pain 9/10, he just received  4 mg Morphine per [facility] Hawking nurse. Admitting MD paged.  Colleen Can, RN

## 2015-09-16 NOTE — ED Notes (Signed)
Pt c/o intermittent LT shoulder radiating to LT chest with SOB since 1030 yesterday morning. Pt states pain became increasingly worse around 0230 this morning. Pt took 1 nitro with no relief. Pt given 1 nitro with no relief and 324 ASA PTA. Pain increases with palpation.

## 2015-09-16 NOTE — ED Notes (Signed)
Patient snoring at present, seems to be resting much better since girlfriend has left.

## 2015-09-16 NOTE — ED Notes (Signed)
[facility] called, will sent truck now.

## 2015-09-17 DIAGNOSIS — I249 Acute ischemic heart disease, unspecified: Secondary | ICD-10-CM | POA: Diagnosis not present

## 2015-09-17 LAB — BASIC METABOLIC PANEL
Anion gap: 7 (ref 5–15)
BUN: 13 mg/dL (ref 6–20)
CHLORIDE: 106 mmol/L (ref 101–111)
CO2: 27 mmol/L (ref 22–32)
CREATININE: 1.02 mg/dL (ref 0.61–1.24)
Calcium: 9 mg/dL (ref 8.9–10.3)
GFR calc non Af Amer: >60 mL/min (ref >60)
Glucose, Bld: 104 mg/dL — ABNORMAL HIGH (ref 65–99)
POTASSIUM: 3.5 mmol/L (ref 3.5–5.1)
Sodium: 140 mmol/L (ref 135–145)

## 2015-09-17 LAB — CBC
HEMATOCRIT: 38.7 % — AB (ref 39.0–52.0)
Hemoglobin: 13.4 g/dL (ref 13.0–17.0)
MCH: 30.5 pg (ref 26.0–34.0)
MCHC: 34.6 g/dL (ref 30.0–36.0)
MCV: 88.2 fL (ref 78.0–100.0)
PLATELETS: 203 10*3/uL (ref 150–400)
RBC: 4.39 MIL/uL (ref 4.22–5.81)
RDW: 13.7 % (ref 11.5–15.5)
WBC: 8.1 10*3/uL (ref 4.0–10.5)

## 2015-09-17 LAB — LIPID PANEL
CHOL/HDL RATIO: 5.3 ratio
Cholesterol: 154 mg/dL (ref 0–200)
HDL: 29 mg/dL — ABNORMAL LOW (ref >40)
LDL CALC: 99 mg/dL (ref 0–99)
Triglycerides: 131 mg/dL (ref <150)
VLDL: 26 mg/dL (ref 0–40)

## 2015-09-17 LAB — TROPONIN I: Troponin I: 19.3 ng/mL (ref <0.031)

## 2015-09-17 MED ORDER — NICOTINE 21 MG/24HR TD PT24: 21.0000 mg | MEDICATED_PATCH | Freq: Every day | TRANSDERMAL | Status: AC

## 2015-09-17 MED ORDER — TICAGRELOR 90 MG PO TABS: 90.0000 mg | ORAL_TABLET | Freq: Two times a day (BID) | ORAL | Status: DC

## 2015-09-17 MED ORDER — ATORVASTATIN CALCIUM 80 MG PO TABS: 80.0000 mg | ORAL_TABLET | Freq: Every day | ORAL | Status: AC

## 2015-09-17 MED ORDER — TICAGRELOR 90 MG PO TABS: 90.0000 mg | ORAL_TABLET | Freq: Two times a day (BID) | ORAL | Status: AC

## 2015-09-17 NOTE — Progress Notes (Signed)
TR BAND REMOVAL  LOCATION:    right radial  DEFLATED PER PROTOCOL:    Yes.    TIME BAND OFF / DRESSING APPLIED:    21:30   SITE UPON ARRIVAL:    Level 0  SITE AFTER BAND REMOVAL:    Level 0  REVERSE ALLEN'S TEST:     positive  CIRCULATION SENSATION AND MOVEMENT:    Within Normal Limits   Yes.    COMMENTS:    

## 2015-09-17 NOTE — Progress Notes (Signed)
CARDIAC REHAB PHASE I   PRE:  Rate/Rhythm: 81 SR  BP:   Sitting: 133/87       MODE:  Ambulation: 950 ft   POST:  Rate/Rhythm: 96 SR  BP:   Sitting: 140/94   Pt ambulated 948ft, independently and without CP and SOB. Pt maintained a steady walking pace. Returned pt to bed with LE elevated and VSS. Pt appears to a bit anxious from lack of cigarette smoking for 2 days. Briefly discussed smoking cessation and gave fake cigarette. Pt did mention "not" wanting to smoke or having the desire to smoke while being in hospital which is rare for him according to pt. Education completed with pt and significant other, in which the following was discussed: smoking cessation, risk factors, HH diet, exercise guidelines, lifting restrictions, stent cards, importance of antiplatelets (Brilinta/ASA), when to use NTG and call 911. Pt referral sent to [facility]   [phone] Sheniya Garciaperez D Glendia Olshefski,MS,ACSM-RCEP 09/17/2015 10:32 AM

## 2015-09-17 NOTE — Discharge Summary (Signed)
Physician Discharge Summary    Cardiologist: Branch Patient ID: Danny Shea MRN: [id number] DOB/AGE: May 22, 1966 49 y.o.  Admit date: 09/16/2015 Discharge date: 09/17/2015  Admission Diagnoses: non-ST elevated myocardial infarction  Discharge Diagnoses:  Active Problems:   NSTEMI (non-ST elevated myocardial infarction)   Acute coronary syndrome (HCC)   Chest pain   Essential hypertension   Coronary artery disease   History of CVA   Tobacco abuse   Chronic back pain and musculoskeletal pain   Obstructive sleep apnea  Discharged Condition: stable  Hospital Course:   Mr. Danny Shea is a 49 y.o.male with known history of hypertension, CHF, hypertensive heart disease, OSA, He had been followed by [facility], DannyPeter Ronnald Shea and by and EP cardiologist.Danny Shea, M.D. He has had a VT ablation in 2010,VT s/p ablation in the past but had been seen by Dr. Dietrich Shea in 2014. Had cardiac cath 12/21/2010 which demonstrated non-critical mild plaquing of the LAD. There was no other obstructive disease in remaining coronary arteries (per scanned report in CV procedures). He has since transferred cardiac care to Dr. Molly Shea in [place]. Saw him last in Sept of 2015. Was told to follow up prn as he did not have any cardiac abnormalities.   He has been given morphine and is difficult to keep awake. Hx is obtained via prior medical records and from fiance' at bedside. 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 The patient's fianc states that he has been feeling bad all week while working in [place]. He has been having constant chest pain. The patient was followed by pain management for chronic pain.  When he got home he began to have severe pain, fiance states that he was writhing and holding his chest. She brought into the emergency room that he decided not to come in and instead take some aspirin and go home. He had taken some pain  medication at home and was sitting in a recliner. He got up to use the bathroom and she states he passed out in the bathroom. His fiance became concerned and called EMS to bring him to ER.  The patient is sedated, but when I do awaken him to assess him, he cries out in pain while I am touching his chest or with moving of his torso or sitting up on the stretcher.  On arrival to the emergency room blood pressure was 147/86, heart rate 73, O2 sat 95%. He had mildly elevated white blood cells of 14.3, neutrophils 12.0. sodium 139, potassium 3.6. ALT was mildly low at 14. Troponin 1.53. EKG revealed normal sinus rhythm, rate of 61 bpm without acute ST-T wave changes. He has been placed on heparin, nitroglycerin, and as stated given morphine.  Patient was admitted he was started on IV heparin. He went for left heart catheterization the same day which revealed severe 2 vessel coronary artery disease total occlusion of the ramus intermedius branch and severe stenosis with sequential lesions in the mid LAD. Both were treated successfully with drug-eluting stents. No significant obstruction and a dominant left circumflex. Mild nonobstructive stenosis of a small nondominant RCA. Preserved LV systolic function. Aspirin, Brilinta. He is also on Lipitor 80 mg, amlodipine 10 mg, clonidine 0.1 mg 3 times a day, Cozaar 100 mg daily, spironolactone 50 mg twice a day.  The patient was seen by Danny Shea who felt he was stable for DC home.   Sleep study evaluation has been arranged through his  neurologist.  Recheck lipids and LFTs in 6 weeks  Consults: cardiac rehabilitation  Significant Diagnostic Studies:  Left heart catheterization  Conclusion     Prox RCA lesion, 30% stenosed.  Mid Cx lesion, 25% stenosed.  Ost Ramus to Ramus lesion, 100% stenosed. There is a 0% residual stenosis post intervention.  A drug-eluting stent was placed.  Mid LAD lesion, 80% stenosed. There is a 0% residual stenosis post  intervention.  A drug-eluting stent was placed.  The left ventricular systolic function is normal.  1. Severe 2 vessel coronary artery disease with total occlusion of the ramus intermedius branch and severe stenosis with sequential lesions in the mid LAD, treated successfully with drug-eluting stent implantation in each vessel  2. No significant obstructive disease and a dominant left circumflex  3. Mild nonobstructive stenosis in a small nondominant RCA  4. Preserved LV systolic function  Recommendations: The patient was loaded with brilinta 180 mg on the table. Recommend dual antiplatelets therapy with aspirin and brilinta for 12 months as tolerated. Aggressive risk reduction measures.   Coronary Diagrams    Diagnostic Diagram           Post-Intervention Diagram         Lipid Panel     Component Value Date/Time   CHOL 154 09/17/2015 0202   TRIG 131 09/17/2015 0202   HDL 29* 09/17/2015 0202   CHOLHDL 5.3 09/17/2015 0202   VLDL 26 09/17/2015 0202   LDLCALC 99 09/17/2015 0202     Treatments: See above  Discharge Exam: Blood pressure 142/74, pulse 70, temperature 98.5 F (36.9 C), temperature source Oral, resp. rate 20, height  (1.803 m), weight 204 lb 5.9 oz (92.7 kg), SpO2 100 %.   Disposition: 01-Home or Self Care      Discharge Instructions    Diet - low sodium heart healthy    Complete by:  As directed      Discharge instructions    Complete by:  As directed   No lifting with right arm for 3 days     Increase activity slowly    Complete by:  As directed             Medication List    TAKE these medications        ALPRAZolam 1 MG tablet  Commonly known as:  XANAX  Take 1 mg by mouth 3 (three) times daily as needed for anxiety.     amLODipine 10 MG tablet  Commonly known as:  NORVASC  Take 10 mg by mouth daily.     aspirin EC 81 MG tablet  Take 81 mg by mouth daily.     atorvastatin 80 MG tablet  Commonly known as:  LIPITOR    Take 1 tablet (80 mg total) by mouth daily at 6 PM.     cloNIDine 0.1 MG tablet  Commonly known as:  CATAPRES  Take 0.1 mg by mouth 3 (three) times daily.     diphenhydrAMINE 50 MG tablet  Commonly known as:  BENADRYL  Take 50 mg by mouth at bedtime.     furosemide 20 MG tablet  Commonly known as:  LASIX  Take 20 mg by mouth 2 (two) times daily as needed for fluid.     losartan 100 MG tablet  Commonly known as:  COZAAR  Take 100 mg by mouth daily.     meloxicam 15 MG tablet  Commonly known as:  MOBIC  Take 15 mg by mouth daily.  nicotine 21 mg/24hr patch  Commonly known as:  NICODERM CQ - dosed in mg/24 hours  Place 1 patch (21 mg total) onto the skin daily.     nitroGLYCERIN 0.4 MG SL tablet  Commonly known as:  NITROSTAT  Place 0.4 mg under the tongue every 5 (five) minutes as needed.     omeprazole 40 MG capsule  Commonly known as:  PRILOSEC  Take 40 mg by mouth daily.     oxycodone 30 MG immediate release tablet  Commonly known as:  ROXICODONE  Take 30 mg by mouth every 4 (four) hours as needed for pain.     promethazine 25 MG tablet  Commonly known as:  PHENERGAN  Take 25 mg by mouth every 8 (eight) hours as needed for nausea.     spironolactone 50 MG tablet  Commonly known as:  ALDACTONE  Take 50 mg by mouth 2 (two) times daily.     ticagrelor 90 MG Tabs tablet  Commonly known as:  BRILINTA  Take 1 tablet (90 mg total) by mouth 2 (two) times daily.     vitamin B-12 1000 MCG tablet  Commonly known as:  CYANOCOBALAMIN  Take 1,000 mcg by mouth daily.       Follow-up Information    Follow up with Dina Rich, MD.   Specialty:  Cardiology   Why:  tthe office will call you with your follow up appointment date and time   Contact information:   [address] [place] [address] [phone]      Greater than 30 minutes was spent completing the patient's discharge.    SignedWilburt Finlay, PAC 09/17/2015, 10:07 AM

## 2015-09-17 NOTE — Progress Notes (Signed)
Subjective: Feeling better.  Objective: Vital signs in last 24 hours: Temp:  [97.9 F (36.6 C)-99.3 F (37.4 C)] 98.6 F (37 C) (10/01 0324) Pulse Rate:  [45-82] 68 (10/01 0324) Resp:  [8-23] 21 (10/01 0324) BP: (78-184)/(25-140) 112/65 mmHg (10/01 0324) SpO2:  [96 %-100 %] 100 % (10/01 0324) Weight:  [204 lb 5.9 oz (92.7 kg)] 204 lb 5.9 oz (92.7 kg) (10/01 0324) Last BM Date: 09/16/15  Intake/Output from previous day: 09/30 0701 - 10/01 0700 In: 555.9 [P.O.:120; I.V.:435.9] Out: 600 [Urine:600] Intake/Output this shift:    Medications Scheduled Meds: . amLODipine  10 mg Oral Daily  . aspirin EC  81 mg Oral Daily  . atorvastatin  80 mg Oral q1800  . cloNIDine  0.1 mg Oral TID  . diphenhydrAMINE  50 mg Oral QHS  . Influenza vac split quadrivalent PF  0.5 mL Intramuscular Once  . losartan  100 mg Oral Daily  . nicotine  21 mg Transdermal Daily  . pantoprazole  40 mg Oral Daily  . sodium chloride  3 mL Intravenous Q12H  . spironolactone  50 mg Oral BID  . ticagrelor  90 mg Oral BID  . vitamin B-12  1,000 mcg Oral Daily   Continuous Infusions:  PRN Meds:.sodium chloride, ALPRAZolam, ondansetron (ZOFRAN) IV, oxycodone, sodium chloride  PE: General appearance: alert, cooperative and no distress Lungs: clear to auscultation bilaterally Heart: regular rate and rhythm, S1, S2 normal, no murmur, click, rub or gallop Extremities: No lower extremity edema Pulses: 2+ and symmetric Skin: Warm and dry.  Right radial cath site: Stable no ecchymosis or hematoma good distal pulse Neurologic: Grossly normal  Lab Results:   Recent Labs  09/16/15 0922 09/17/15 0202  WBC 14.3* 8.1  HGB 14.2 13.4  HCT 40.9 38.7*  PLT 246 203   BMET  Recent Labs  09/16/15 0922 09/17/15 0202  NA 139 140  K 3.6 3.5  CL 108 106  CO2 23 27  GLUCOSE 102* 104*  BUN 20 13  CREATININE 1.14 1.02  CALCIUM 9.4 9.0   PT/INR No results for input(s): LABPROT, INR in the last 72  hours. Cholesterol  Recent Labs  09/17/15 0202  CHOL 154   Lipid Panel     Component Value Date/Time   CHOL 154 09/17/2015 0202   TRIG 131 09/17/2015 0202   HDL 29* 09/17/2015 0202   CHOLHDL 5.3 09/17/2015 0202   VLDL 26 09/17/2015 0202   LDLCALC 99 09/17/2015 0202   Cardiac Panel (last 3 results)  Recent Labs  09/16/15 1516 09/16/15 2012 09/17/15 0202  TROPONINI 7.99* 35.99* 19.30*       Assessment/Plan 49 y.o.male with known history of hypertension, CHF, hypertensive heart disease, OSA, He had been followed by [facility], Dr.Peter Ronnald Ramp and by and EP cardiologist.Mark D. Bernette Mayers, M.D. He has had a VT ablation in 2010,VT s/p ablation in the past but had been seen by Dr. Dietrich Pates in 2014. Had cardiac cath 12/21/2010 which demonstrated non-critical mild plaquing of the LAD.  Active Problems:   NSTEMI (non-ST elevated myocardial infarction)   coronary artery disease Hypertension History of CVA Tobacco abuse Chronic back pain and muscular skeletal pain Obstructive sleep apnea  Status post left heart catheterization revealing severe 2 vessel coronary artery disease total occlusion of the ramus intermedius branch and severe stenosis with sequential lesions in the mid LAD. Both were treated successfully with drug-eluting stents. No significant obstruction and a dominant left circumflex. Mild nonobstructive stenosis  of a small nondominant RCA. Preserved LV systolic function.  Aspirin Brilinta.  He is also on Lipitor 80 mg, amlodipine 10 mg, clonidine 0.1 mg 3 times a day, Cozaar 100 mg daily, spironolactone 50 mg twice a day.  Sleep study evaluation has been arranged through his neurologist.  Ambulate this morning. He does well, discharged home.  Follow-up in [place].   LOS: 1 day    HAGER, BRYAN PA-C 09/17/2015 8:06 AM   I have seen, examined the patient, and reviewed the above assessment and plan.  On exam, sleeping but arouses.  Changes to above are made where necessary.    Co Sign: Hillis Range, MD 09/17/2015 9:05 AM

## 2015-09-18 NOTE — Progress Notes (Signed)
Cm met with pt and gave pt Brilinta free 30 day trial card.  Pt verbalized understanding the card will pay for today's prescription and give enough time for authorization by insurance for medication refills to be covered by insurance.  No other CM needs were communicated. 

## 2015-09-19 ENCOUNTER — Other Ambulatory Visit (HOSPITAL_COMMUNITY): Payer: Self-pay | Admitting: Physician Assistant

## 2015-09-19 DIAGNOSIS — M5126 Other intervertebral disc displacement, lumbar region: Secondary | ICD-10-CM

## 2015-09-20 ENCOUNTER — Ambulatory Visit (HOSPITAL_COMMUNITY): Payer: Managed Care, Other (non HMO)

## 2015-09-29 ENCOUNTER — Encounter (INDEPENDENT_AMBULATORY_CARE_PROVIDER_SITE_OTHER): Payer: Self-pay | Admitting: *Deleted

## 2015-10-20 ENCOUNTER — Ambulatory Visit (HOSPITAL_COMMUNITY): Payer: Managed Care, Other (non HMO)

## 2015-10-24 ENCOUNTER — Encounter: Payer: Managed Care, Other (non HMO) | Admitting: Cardiology

## 2015-10-24 ENCOUNTER — Encounter: Payer: Self-pay | Admitting: Cardiology

## 2015-10-24 DIAGNOSIS — R0989 Other specified symptoms and signs involving the circulatory and respiratory systems: Secondary | ICD-10-CM

## 2015-10-24 NOTE — Progress Notes (Signed)
Patient ID: Danny RaiderFreddie L Shea, male   DOB: [birth year], 49 y.o.   MRN: [id number]    Encounter opened in error
